# Patient Record
Sex: Female | Born: 1940 | Race: White | Hispanic: No | Smoking: Former smoker
Health system: Southern US, Community
[De-identification: ages and names within clinical notes are randomized; demographics above are authoritative.]

## PROBLEM LIST (undated history)

## (undated) DIAGNOSIS — E785 Hyperlipidemia, unspecified: Secondary | ICD-10-CM

## (undated) DIAGNOSIS — L130 Dermatitis herpetiformis: Secondary | ICD-10-CM

## (undated) DIAGNOSIS — K869 Disease of pancreas, unspecified: Secondary | ICD-10-CM

## (undated) DIAGNOSIS — N289 Disorder of kidney and ureter, unspecified: Secondary | ICD-10-CM

## (undated) DIAGNOSIS — K900 Celiac disease: Secondary | ICD-10-CM

## (undated) DIAGNOSIS — E0590 Thyrotoxicosis, unspecified without thyrotoxic crisis or storm: Secondary | ICD-10-CM

## (undated) DIAGNOSIS — K745 Biliary cirrhosis, unspecified: Secondary | ICD-10-CM

## (undated) DIAGNOSIS — F419 Anxiety disorder, unspecified: Secondary | ICD-10-CM

## (undated) DIAGNOSIS — K219 Gastro-esophageal reflux disease without esophagitis: Secondary | ICD-10-CM

## (undated) DIAGNOSIS — J449 Chronic obstructive pulmonary disease, unspecified: Secondary | ICD-10-CM

## (undated) DIAGNOSIS — K5792 Diverticulitis of intestine, part unspecified, without perforation or abscess without bleeding: Secondary | ICD-10-CM

## (undated) HISTORY — DX: Disease of pancreas, unspecified: K86.9

## (undated) HISTORY — PX: COLONOSCOPY: SHX174

## (undated) HISTORY — DX: Biliary cirrhosis, unspecified: K74.5

## (undated) HISTORY — PX: ELBOW SURGERY: SHX618

## (undated) HISTORY — DX: Diverticulitis of intestine, part unspecified, without perforation or abscess without bleeding: K57.92

## (undated) HISTORY — DX: Dermatitis herpetiformis: L13.0

## (undated) HISTORY — PX: MULTIPLE TOOTH EXTRACTIONS: SHX2053

## (undated) HISTORY — DX: Disorder of kidney and ureter, unspecified: N28.9

## (undated) HISTORY — DX: Celiac disease: K90.0

## (undated) HISTORY — PX: EYE SURGERY: SHX253

## (undated) HISTORY — DX: Anxiety disorder, unspecified: F41.9

## (undated) HISTORY — PX: UPPER GI ENDOSCOPY: SHX6162

## (undated) HISTORY — DX: Thyrotoxicosis, unspecified without thyrotoxic crisis or storm: E05.90

---

## 1960-11-25 HISTORY — PX: PANCREAS SURGERY: SHX731

## 1963-11-26 HISTORY — PX: THYROIDECTOMY: SHX17

## 2000-08-19 ENCOUNTER — Other Ambulatory Visit: Admission: RE | Admit: 2000-08-19 | Discharge: 2000-08-19 | Payer: Self-pay | Admitting: Obstetrics and Gynecology

## 2000-09-03 ENCOUNTER — Encounter: Payer: Self-pay | Admitting: Obstetrics and Gynecology

## 2000-09-03 ENCOUNTER — Encounter: Admission: RE | Admit: 2000-09-03 | Discharge: 2000-09-03 | Payer: Self-pay | Admitting: Obstetrics and Gynecology

## 2001-09-15 ENCOUNTER — Other Ambulatory Visit: Admission: RE | Admit: 2001-09-15 | Discharge: 2001-09-15 | Payer: Self-pay | Admitting: Obstetrics and Gynecology

## 2001-09-17 ENCOUNTER — Encounter: Payer: Self-pay | Admitting: Obstetrics and Gynecology

## 2001-09-17 ENCOUNTER — Encounter: Admission: RE | Admit: 2001-09-17 | Discharge: 2001-09-17 | Payer: Self-pay | Admitting: Obstetrics and Gynecology

## 2002-01-20 ENCOUNTER — Ambulatory Visit (HOSPITAL_COMMUNITY): Admission: RE | Admit: 2002-01-20 | Discharge: 2002-01-20 | Payer: Self-pay | Admitting: Gastroenterology

## 2002-01-20 ENCOUNTER — Encounter (INDEPENDENT_AMBULATORY_CARE_PROVIDER_SITE_OTHER): Payer: Self-pay | Admitting: *Deleted

## 2002-01-20 ENCOUNTER — Encounter: Payer: Self-pay | Admitting: Gastroenterology

## 2002-09-21 ENCOUNTER — Encounter: Payer: Self-pay | Admitting: Pulmonary Disease

## 2002-09-21 ENCOUNTER — Encounter (INDEPENDENT_AMBULATORY_CARE_PROVIDER_SITE_OTHER): Payer: Self-pay | Admitting: Specialist

## 2002-09-21 ENCOUNTER — Encounter (INDEPENDENT_AMBULATORY_CARE_PROVIDER_SITE_OTHER): Payer: Self-pay | Admitting: *Deleted

## 2002-09-21 ENCOUNTER — Ambulatory Visit (HOSPITAL_COMMUNITY): Admission: RE | Admit: 2002-09-21 | Discharge: 2002-09-21 | Payer: Self-pay | Admitting: Gastroenterology

## 2003-07-05 ENCOUNTER — Encounter: Admission: RE | Admit: 2003-07-05 | Discharge: 2003-07-05 | Payer: Self-pay | Admitting: *Deleted

## 2003-07-05 ENCOUNTER — Encounter: Payer: Self-pay | Admitting: *Deleted

## 2003-07-08 ENCOUNTER — Other Ambulatory Visit: Admission: RE | Admit: 2003-07-08 | Discharge: 2003-07-08 | Payer: Self-pay | Admitting: *Deleted

## 2004-07-27 ENCOUNTER — Ambulatory Visit (HOSPITAL_COMMUNITY): Admission: RE | Admit: 2004-07-27 | Discharge: 2004-07-27 | Payer: Self-pay | Admitting: Obstetrics and Gynecology

## 2004-10-16 ENCOUNTER — Other Ambulatory Visit: Admission: RE | Admit: 2004-10-16 | Discharge: 2004-10-16 | Payer: Self-pay | Admitting: *Deleted

## 2005-03-20 ENCOUNTER — Ambulatory Visit: Payer: Self-pay | Admitting: Pulmonary Disease

## 2005-04-03 ENCOUNTER — Ambulatory Visit: Payer: Self-pay | Admitting: Pulmonary Disease

## 2005-04-08 ENCOUNTER — Ambulatory Visit: Payer: Self-pay | Admitting: Pulmonary Disease

## 2005-10-03 ENCOUNTER — Ambulatory Visit: Payer: Self-pay | Admitting: Pulmonary Disease

## 2006-01-15 ENCOUNTER — Encounter: Admission: RE | Admit: 2006-01-15 | Discharge: 2006-01-15 | Payer: Self-pay | Admitting: *Deleted

## 2006-02-14 ENCOUNTER — Other Ambulatory Visit: Admission: RE | Admit: 2006-02-14 | Discharge: 2006-02-14 | Payer: Self-pay | Admitting: Obstetrics and Gynecology

## 2006-04-02 ENCOUNTER — Ambulatory Visit: Payer: Self-pay | Admitting: Pulmonary Disease

## 2006-04-08 ENCOUNTER — Ambulatory Visit: Payer: Self-pay | Admitting: Pulmonary Disease

## 2006-10-01 ENCOUNTER — Ambulatory Visit: Payer: Self-pay | Admitting: Pulmonary Disease

## 2006-10-09 ENCOUNTER — Ambulatory Visit: Payer: Self-pay | Admitting: Pulmonary Disease

## 2007-02-19 ENCOUNTER — Encounter: Admission: RE | Admit: 2007-02-19 | Discharge: 2007-02-19 | Payer: Self-pay | Admitting: Obstetrics and Gynecology

## 2007-09-23 LAB — HM COLONOSCOPY

## 2008-02-18 ENCOUNTER — Ambulatory Visit (HOSPITAL_COMMUNITY): Admission: RE | Admit: 2008-02-18 | Discharge: 2008-02-18 | Payer: Self-pay | Admitting: Obstetrics and Gynecology

## 2008-08-18 DIAGNOSIS — J069 Acute upper respiratory infection, unspecified: Secondary | ICD-10-CM | POA: Insufficient documentation

## 2008-08-18 DIAGNOSIS — M1990 Unspecified osteoarthritis, unspecified site: Secondary | ICD-10-CM | POA: Insufficient documentation

## 2008-08-18 DIAGNOSIS — E039 Hypothyroidism, unspecified: Secondary | ICD-10-CM

## 2008-08-18 DIAGNOSIS — K219 Gastro-esophageal reflux disease without esophagitis: Secondary | ICD-10-CM | POA: Insufficient documentation

## 2008-08-18 DIAGNOSIS — K5732 Diverticulitis of large intestine without perforation or abscess without bleeding: Secondary | ICD-10-CM

## 2008-08-18 DIAGNOSIS — J209 Acute bronchitis, unspecified: Secondary | ICD-10-CM | POA: Insufficient documentation

## 2008-08-18 DIAGNOSIS — E785 Hyperlipidemia, unspecified: Secondary | ICD-10-CM | POA: Insufficient documentation

## 2008-08-18 DIAGNOSIS — D126 Benign neoplasm of colon, unspecified: Secondary | ICD-10-CM

## 2008-08-18 DIAGNOSIS — M810 Age-related osteoporosis without current pathological fracture: Secondary | ICD-10-CM

## 2008-08-18 DIAGNOSIS — F411 Generalized anxiety disorder: Secondary | ICD-10-CM | POA: Insufficient documentation

## 2008-08-18 DIAGNOSIS — K745 Biliary cirrhosis, unspecified: Secondary | ICD-10-CM

## 2008-10-17 ENCOUNTER — Encounter: Payer: Self-pay | Admitting: Pulmonary Disease

## 2008-11-11 ENCOUNTER — Encounter: Payer: Self-pay | Admitting: Pulmonary Disease

## 2009-01-26 ENCOUNTER — Encounter: Payer: Self-pay | Admitting: Pulmonary Disease

## 2009-02-14 ENCOUNTER — Encounter: Payer: Self-pay | Admitting: Pulmonary Disease

## 2009-09-28 ENCOUNTER — Encounter: Payer: Self-pay | Admitting: Internal Medicine

## 2009-10-17 ENCOUNTER — Ambulatory Visit (HOSPITAL_COMMUNITY): Admission: RE | Admit: 2009-10-17 | Discharge: 2009-10-17 | Payer: Self-pay | Admitting: Nephrology

## 2009-10-17 ENCOUNTER — Ambulatory Visit: Payer: Self-pay

## 2009-10-17 ENCOUNTER — Ambulatory Visit: Payer: Self-pay | Admitting: Cardiology

## 2009-10-17 ENCOUNTER — Encounter (INDEPENDENT_AMBULATORY_CARE_PROVIDER_SITE_OTHER): Payer: Self-pay | Admitting: Nephrology

## 2009-10-27 ENCOUNTER — Ambulatory Visit: Payer: Self-pay | Admitting: Internal Medicine

## 2009-10-27 DIAGNOSIS — I959 Hypotension, unspecified: Secondary | ICD-10-CM | POA: Insufficient documentation

## 2009-11-08 ENCOUNTER — Ambulatory Visit (HOSPITAL_BASED_OUTPATIENT_CLINIC_OR_DEPARTMENT_OTHER): Admission: RE | Admit: 2009-11-08 | Discharge: 2009-11-08 | Payer: Self-pay | Admitting: Obstetrics and Gynecology

## 2009-11-08 ENCOUNTER — Ambulatory Visit: Payer: Self-pay | Admitting: Radiology

## 2010-05-15 ENCOUNTER — Encounter: Admission: RE | Admit: 2010-05-15 | Discharge: 2010-05-15 | Payer: Self-pay | Admitting: Nephrology

## 2010-10-09 ENCOUNTER — Encounter: Payer: Self-pay | Admitting: Pulmonary Disease

## 2010-10-23 ENCOUNTER — Encounter: Payer: Self-pay | Admitting: Pulmonary Disease

## 2010-11-22 ENCOUNTER — Encounter: Payer: Self-pay | Admitting: Pulmonary Disease

## 2010-12-10 ENCOUNTER — Ambulatory Visit (HOSPITAL_BASED_OUTPATIENT_CLINIC_OR_DEPARTMENT_OTHER)
Admission: RE | Admit: 2010-12-10 | Discharge: 2010-12-10 | Payer: Self-pay | Source: Home / Self Care | Attending: Obstetrics and Gynecology | Admitting: Obstetrics and Gynecology

## 2010-12-17 ENCOUNTER — Other Ambulatory Visit: Payer: Self-pay

## 2010-12-17 ENCOUNTER — Ambulatory Visit
Admission: RE | Admit: 2010-12-17 | Discharge: 2010-12-17 | Payer: Self-pay | Source: Home / Self Care | Attending: Internal Medicine | Admitting: Internal Medicine

## 2010-12-17 ENCOUNTER — Other Ambulatory Visit
Admission: RE | Admit: 2010-12-17 | Discharge: 2010-12-17 | Payer: Self-pay | Source: Home / Self Care | Admitting: Internal Medicine

## 2010-12-21 ENCOUNTER — Encounter: Payer: Self-pay | Admitting: Pulmonary Disease

## 2010-12-24 ENCOUNTER — Encounter: Payer: Self-pay | Admitting: Internal Medicine

## 2011-01-02 NOTE — Letter (Signed)
Summary: New Patient letter  [facility]  [place], [place] [address]   Phone: [phone]  Fax: [phone]       12/24/2010 MRN: [id number]  [facility] [address] [place], [place]  [address]  Dear Ms. Camey,  Welcome to the Gastroenterology Division at [facility].    You are scheduled to see Dr.  Henrene Pastor on 01-29-11 at 1:45pm on the 3rd floor at [facility] [place] [facility].  We ask that you try to arrive at our office 15 minutes prior to your appointment time to allow for check-in.  We would like you to complete the enclosed self-administered evaluation form prior to your visit and bring it with you on the day of your appointment.  We will review it with you.  Also, please bring a complete list of all your medications or, if you prefer, bring the medication bottles and we will list them.  Please bring your insurance card so that we may make a copy of it.  If your insurance requires a referral to see a specialist, please bring your referral form from your primary care physician.  Co-payments are due at the time of your visit and may be paid by cash, check or credit card.     Your office visit will consist of a consult with your physician (includes a physical exam), any laboratory testing he/she may order, scheduling of any necessary diagnostic testing (e.g. x-ray, ultrasound, CT-scan), and scheduling of a procedure (e.g. Endoscopy, Colonoscopy) if required.  Please allow enough time on your schedule to allow for any/all of these possibilities.    If you cannot keep your appointment, please call [phone] to cancel or reschedule prior to your appointment date.  This allows [place] the opportunity to schedule an appointment for another patient in need of care.  If you do not cancel or reschedule by 5 p.m. the business day prior to your appointment date, you will be charged a $50.00 late cancellation/no-show fee.    Thank you for choosing [facility] for your medical needs.  We appreciate the opportunity to care for you.  Please visit [place] at our website  to learn more about our practice.                     Sincerely,                                                             The Gastroenterology Division

## 2011-01-29 ENCOUNTER — Ambulatory Visit: Payer: Self-pay | Admitting: Internal Medicine

## 2011-01-31 NOTE — Procedures (Signed)
Summary: Colonoscopy-Dr. Medoff   Colonoscopy  Procedure date:  09/21/2002  Findings:      Location:  [facility]  Biliary CirrhosisResults:  Normal.   Colonoscopy  Procedure date:  09/21/2002  Findings:      Location:  [facility]  Biliary CirrhosisResults:  Normal.  Patient Name: Shari Schmitt, Shari Schmitt MRN: [id number] Procedure Procedures: Percutaneous Liver BiopsyCPT: 47000.   with Biopsy(s) / Brushing(s)  Blind Exam Personnel: Examiner: Mayme Genta, MD, Marval Regal.  Exam Location: Exam performed in Endoscopy Suite. Outpatient  Patient Consent: Procedure, Alternatives, Risks and Benefits discussed, consent obtained, from patient. Consent was obtained by the physician.  Indications  Evaluation of: Established Primary Biliary Cirrhosis.  History  Current Medications: Other: Proventil Other: Actonel PPI: Esomeprazole/Nexium Hormone therapy: Synthroid Bile Acids: Ursodiol Other: Alprazolam  Medical / Surgical History: Asthma, Osteoarthritis, Reflux Disease, Hypothyroidism, H/o pancreatic resection,  Allergies: Patient is allergic to Codeine.  Patient Habits Patient does not smoke. non-drinker drinks per day.  Pre-Exam Physical: Performed Sep 21, 2002. Entire physical exam was normal.  This exam is being done to assess current degree of liver injury.  Etiology: PBC/PSC.  Severity: Child's-Pugh Classification: A.  Exam Patient: ASA Classification: I. Tolerance: excellent.  Monitoring: Supplemental O2 given. BP and pulse monitoring done. Oximetry used. Vital signs were monitored every 15 mins. time four  Sedation Meds: Patient assessed and found to be appropriate for moderate (conscious) sedation. Sedation was managed by the Endoscopist. Versed 2 mg. Fentanyl 25 mcg.  Positioning: Patient in supine position with right arm raised.  Prep: The right side was percussed for site location. The site was prepped with Betadine Swabs, using 3  swabs. Sterile drapes were placed. Local anesthesia with 1% xylocaine. A total of 1 passes were made with a [place] needle, 16 gauge.  Exam Completion: A bandage was placed over procedure site.  Results  Liver Biopsy Results: Core material was obtained,  length 3 cms of non-fragmented material.  sent to lab. CPT: Liver BX.  Assessment Normal examination. The procedure was successful.  Diagnoses: ICD-9 Code: 571.6:  Primary Biliary Cirrhosis. ICD-9 Code: Marland Kitchen   Events  Unplanned Intervention: No intervention was required.  Unplanned Events: There were no complications. Plans  Post Exam Instructions: Nothing to eat or drink for 2.Clear or full liquids: 2. Resume previous diet: 4. No alcohol: indefinitely. No aspirin or non- steroidal containing medications: 2 weeks. Restart medications: tomorrow.  Medication(s): Continue current medications.  Disposition: After procedure patient sent to short stay unit. After recovery patient sent home.  Scheduling: Office Visit, 2 weeks - to review pathology report and discuss role of anti-viral treatment.,    This report was created from the original endoscopy report, which was reviewed and signed by the above listed endoscopist.   cc:  Teressa Lower, MD

## 2011-02-05 NOTE — Op Note (Signed)
Summary: Liver Biopsy / [facility] [place]  Liver Biopsy / [place]   Imported By: Rise Patience 01/30/2011 11:24:59  _____________________________________________________________________  External Attachment:    Type:   Image     Comment:   External Document

## 2011-02-05 NOTE — Letter (Signed)
Summary: Progress Note  Progress Note   Imported By: Rise Patience 01/30/2011 11:29:30  _____________________________________________________________________  External Attachment:    Type:   Image     Comment:   External Document

## 2011-02-05 NOTE — Letter (Signed)
Summary: Office Note  Office Note   Imported By: Rise Patience 01/30/2011 11:28:33  _____________________________________________________________________  External Attachment:    Type:   Image     Comment:   External Document

## 2011-03-06 ENCOUNTER — Other Ambulatory Visit (INDEPENDENT_AMBULATORY_CARE_PROVIDER_SITE_OTHER): Payer: Medicare Other

## 2011-03-06 ENCOUNTER — Ambulatory Visit (INDEPENDENT_AMBULATORY_CARE_PROVIDER_SITE_OTHER): Payer: Medicare Other | Admitting: Gastroenterology

## 2011-03-06 ENCOUNTER — Encounter: Payer: Self-pay | Admitting: Gastroenterology

## 2011-03-06 VITALS — BP 128/76 | HR 104 | Ht 61.0 in | Wt 111.0 lb

## 2011-03-06 DIAGNOSIS — K7460 Unspecified cirrhosis of liver: Secondary | ICD-10-CM

## 2011-03-06 DIAGNOSIS — R748 Abnormal levels of other serum enzymes: Secondary | ICD-10-CM

## 2011-03-06 DIAGNOSIS — N189 Chronic kidney disease, unspecified: Secondary | ICD-10-CM | POA: Insufficient documentation

## 2011-03-06 DIAGNOSIS — K900 Celiac disease: Secondary | ICD-10-CM | POA: Insufficient documentation

## 2011-03-06 DIAGNOSIS — L130 Dermatitis herpetiformis: Secondary | ICD-10-CM | POA: Insufficient documentation

## 2011-03-06 DIAGNOSIS — K745 Biliary cirrhosis, unspecified: Secondary | ICD-10-CM

## 2011-03-06 DIAGNOSIS — R740 Nonspecific elevation of levels of transaminase and lactic acid dehydrogenase [LDH]: Secondary | ICD-10-CM

## 2011-03-06 DIAGNOSIS — R7402 Elevation of levels of lactic acid dehydrogenase (LDH): Secondary | ICD-10-CM

## 2011-03-06 LAB — CBC WITH DIFFERENTIAL/PLATELET
Basophils Absolute: 0 10*3/uL (ref 0.0–0.1)
HCT: 39.7 % (ref 36.0–46.0)
Lymphs Abs: 1.5 10*3/uL (ref 0.7–4.0)
MCV: 87.2 fl (ref 78.0–100.0)
Monocytes Absolute: 0.5 10*3/uL (ref 0.1–1.0)
Monocytes Relative: 6.7 % (ref 3.0–12.0)
Platelets: 259 10*3/uL (ref 150.0–400.0)
RDW: 14.3 % (ref 11.5–14.6)

## 2011-03-06 LAB — COMPREHENSIVE METABOLIC PANEL
AST: 25 U/L (ref 0–37)
Albumin: 3.8 g/dL (ref 3.5–5.2)
BUN: 19 mg/dL (ref 6–23)
CO2: 26 mEq/L (ref 19–32)
Calcium: 9.2 mg/dL (ref 8.4–10.5)
Chloride: 103 mEq/L (ref 96–112)
Potassium: 4.1 mEq/L (ref 3.5–5.1)

## 2011-03-06 LAB — PROTIME-INR: INR: 1 ratio (ref 0.8–1.0)

## 2011-03-06 NOTE — Patient Instructions (Signed)
You will go to the basement for labs today Cc. Shari Schmitt

## 2011-03-06 NOTE — Progress Notes (Signed)
History of Present Illness:  Shari Schmitt is a pleasant, 70 year old white female referred at the request of Dr. Gordy Savers for ongoing treatment of primary biliary cirrhosis. This was diagnosed by liver biopsy in 1994. She subsequently underwent liver biopsy in 2003 and again at 2008. The last biopsy report is not available. 2003 biopsy suggested mild hepatitis with mild inflammation of the portal tracts.   Changes were felt  consistent with primary biliary cirrhosis. 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 She takes [employer]. She also has a history of celiac disease and dermatitis herpetiformis and maintains a gluten-free diet. She has a history of polyps and apparently was polyp-free after 2008 colonoscopy. Upper endoscopy apparently was normal.  She has no symptoms except for fatigue and occasional skin rash. She is feeling well. She has no specific GI complaints.  Review of Systems: Joint pains in her hands and legs.  Pertinent positive and negative review of systems were noted in the above HPI section. All other review of systems were otherwise negative.    Current Medications, Allergies, Past Medical History, Past Surgical History, Family History and Social History were reviewed in [facility] record  Vital signs were reviewed in today's medical record. Physical Exam: General: Well developed , well nourished, no acute distress Head: Normocephalic and atraumatic Eyes:  sclerae anicteric, EOMI Ears: Normal auditory acuity Mouth: No deformity or lesions Lungs: Clear throughout to auscultation Heart: Regular rate and rhythm; no murmurs, rubs or bruits Abdomen: Soft, non tender and non distended. No masses, hepatosplenomegaly or hernias noted. Normal Bowel sounds Rectal:deferred Musculoskeletal: Symmetrical with no gross deformities  Pulses:  Normal pulses noted Extremities: No clubbing, cyanosis, edema or deformities noted Neurological: Alert oriented x 4, grossly  nonfocal Psychological:  Alert and cooperative. Normal mood and affect

## 2011-03-06 NOTE — Assessment & Plan Note (Signed)
Plan to review prior records

## 2011-03-06 NOTE — Assessment & Plan Note (Signed)
Plan to check  celiac panel

## 2011-03-06 NOTE — Assessment & Plan Note (Addendum)
By history she has primary biliary cirrhosis.   There is no stigmata of liver disease to suggest advanced cirrhosis.  Recommendations #1 check CBC, INR, compress, and metabolic profile, and antimitochondrial antibody. #2 review prior biopsy and endoscopy reports.  #3 continue Urso

## 2011-03-07 LAB — GLIADIN ANTIBODIES, SERUM: Gliadin IgG: 4.4 U/mL (ref <20)

## 2011-03-14 ENCOUNTER — Telehealth: Payer: Self-pay

## 2011-03-14 NOTE — Telephone Encounter (Signed)
Message copied by Algernon Huxley on Thu Mar 14, 2011  2:17 PM ------      Message from: Erskine Emery MD      Created: Sun Mar 10, 2011 11:21 PM       sshe needs egd with bx to confirm celiac dz

## 2011-03-14 NOTE — Telephone Encounter (Signed)
Pt aware of the lab results per Dr. Deatra Ina and his recommendations. Pt states that she will call me back to schedule the procedure tomorrow when she is home.

## 2011-03-26 NOTE — Telephone Encounter (Signed)
Called and left message for pt to call back to scheduled egd with biopsy to confirm celiac disease.

## 2011-04-01 NOTE — Telephone Encounter (Signed)
Left message for pt to call back  °

## 2011-04-04 NOTE — Telephone Encounter (Signed)
Left message for pt to call back to schedule procedure

## 2011-04-05 NOTE — Telephone Encounter (Signed)
ok 

## 2011-04-05 NOTE — Telephone Encounter (Signed)
Left message for pt to call back.  Pt called back and states that at this point in her life she cannot schedule the EGD, she is going through a separation and through counseling. She states she will call back when she can schedule the procedure.

## 2011-04-29 ENCOUNTER — Telehealth: Payer: Self-pay

## 2011-04-29 NOTE — Telephone Encounter (Signed)
Message copied by Faythe Casa on Mon Apr 29, 2011 10:28 AM ------      Message from: Erskine Emery MD D      Created: Mon Apr 29, 2011  9:42 AM       Results are equivocal for celiac disease the patient should have an endoscopy with small bowel biopsies

## 2011-04-29 NOTE — Telephone Encounter (Signed)
Message copied by Faythe Casa on Mon Apr 29, 2011 10:23 AM ------      Message from: Erskine Emery MD D      Created: Mon Apr 29, 2011  9:42 AM       Results are equivocal for celiac disease the patient should have an endoscopy with small bowel biopsies

## 2011-04-29 NOTE — Telephone Encounter (Signed)
Pts labs are equivocal for celiac disease. Called and left a message for the pts son to obtain a working number for the pt. All numbers for her are non-working numbers. Pt needs to be scheduled for an endo with small bowel biopsies. Left message for son to call back.

## 2011-05-03 ENCOUNTER — Telehealth: Payer: Self-pay

## 2011-05-03 NOTE — Telephone Encounter (Signed)
Pt states she will call us back when she is ready to schedule the EGD.

## 2011-05-03 NOTE — Telephone Encounter (Signed)
Message copied by Faythe Casa on Fri May 03, 2011  9:17 AM ------      Message from: Erskine Emery MD D      Created: Mon Apr 29, 2011  9:42 AM       Results are equivocal for celiac disease the patient should have an endoscopy with small bowel biopsies

## 2011-05-03 NOTE — Telephone Encounter (Signed)
Pt aware of Dr. Kelby Fam recommendations. She states that at this point she has just recently separated from her husband and she cannot do the EGD now. Pt states she will call us back when she is ready to schedule the procedure.

## 2011-05-06 ENCOUNTER — Telehealth: Payer: Self-pay | Admitting: *Deleted

## 2011-05-06 NOTE — Telephone Encounter (Signed)
Opened in error

## 2011-05-14 ENCOUNTER — Ambulatory Visit (INDEPENDENT_AMBULATORY_CARE_PROVIDER_SITE_OTHER): Payer: Medicare Other | Admitting: Internal Medicine

## 2011-05-14 DIAGNOSIS — M810 Age-related osteoporosis without current pathological fracture: Secondary | ICD-10-CM

## 2011-05-14 DIAGNOSIS — E785 Hyperlipidemia, unspecified: Secondary | ICD-10-CM

## 2011-05-15 ENCOUNTER — Other Ambulatory Visit: Payer: Self-pay | Admitting: Nephrology

## 2011-05-15 ENCOUNTER — Ambulatory Visit
Admission: RE | Admit: 2011-05-15 | Discharge: 2011-05-15 | Disposition: A | Discharge status: Home / Self Care | Payer: Medicare Other | Source: Ambulatory Visit | Attending: Nephrology | Admitting: Nephrology

## 2011-05-15 DIAGNOSIS — Z87891 Personal history of nicotine dependence: Secondary | ICD-10-CM

## 2011-12-25 ENCOUNTER — Other Ambulatory Visit: Payer: Self-pay | Admitting: Internal Medicine

## 2011-12-25 DIAGNOSIS — Z1231 Encounter for screening mammogram for malignant neoplasm of breast: Secondary | ICD-10-CM

## 2012-01-01 ENCOUNTER — Ambulatory Visit (HOSPITAL_BASED_OUTPATIENT_CLINIC_OR_DEPARTMENT_OTHER)
Admission: RE | Admit: 2012-01-01 | Discharge: 2012-01-01 | Disposition: A | Discharge status: Home / Self Care | Payer: Medicare Other | Source: Ambulatory Visit | Attending: Internal Medicine | Admitting: Internal Medicine

## 2012-01-01 DIAGNOSIS — Z1231 Encounter for screening mammogram for malignant neoplasm of breast: Secondary | ICD-10-CM | POA: Diagnosis not present

## 2012-02-06 DIAGNOSIS — R7989 Other specified abnormal findings of blood chemistry: Secondary | ICD-10-CM | POA: Diagnosis not present

## 2012-04-22 ENCOUNTER — Other Ambulatory Visit: Payer: Self-pay | Admitting: Nephrology

## 2012-07-08 DIAGNOSIS — I10 Essential (primary) hypertension: Secondary | ICD-10-CM | POA: Diagnosis not present

## 2012-07-08 DIAGNOSIS — Z0000 Encounter for general adult medical examination without abnormal findings: Secondary | ICD-10-CM | POA: Diagnosis not present

## 2012-07-08 DIAGNOSIS — R7989 Other specified abnormal findings of blood chemistry: Secondary | ICD-10-CM | POA: Diagnosis not present

## 2012-07-16 ENCOUNTER — Other Ambulatory Visit: Payer: Self-pay | Admitting: Nephrology

## 2012-09-07 DIAGNOSIS — Z23 Encounter for immunization: Secondary | ICD-10-CM | POA: Diagnosis not present

## 2012-12-28 DIAGNOSIS — M810 Age-related osteoporosis without current pathological fracture: Secondary | ICD-10-CM | POA: Diagnosis not present

## 2013-01-01 ENCOUNTER — Other Ambulatory Visit (HOSPITAL_BASED_OUTPATIENT_CLINIC_OR_DEPARTMENT_OTHER): Payer: Self-pay | Admitting: Nephrology

## 2013-01-01 DIAGNOSIS — Z1231 Encounter for screening mammogram for malignant neoplasm of breast: Secondary | ICD-10-CM

## 2013-01-04 ENCOUNTER — Ambulatory Visit (HOSPITAL_BASED_OUTPATIENT_CLINIC_OR_DEPARTMENT_OTHER): Payer: Medicare Other

## 2013-01-11 ENCOUNTER — Ambulatory Visit (HOSPITAL_BASED_OUTPATIENT_CLINIC_OR_DEPARTMENT_OTHER)
Admission: RE | Admit: 2013-01-11 | Discharge: 2013-01-11 | Disposition: A | Discharge status: Home / Self Care | Payer: Medicare Other | Source: Ambulatory Visit | Attending: Nephrology | Admitting: Nephrology

## 2013-01-11 DIAGNOSIS — Z1231 Encounter for screening mammogram for malignant neoplasm of breast: Secondary | ICD-10-CM | POA: Insufficient documentation

## 2013-01-29 ENCOUNTER — Ambulatory Visit: Payer: Medicare Other | Admitting: Family

## 2013-02-01 ENCOUNTER — Ambulatory Visit (INDEPENDENT_AMBULATORY_CARE_PROVIDER_SITE_OTHER): Payer: Medicare Other | Admitting: Family

## 2013-02-01 ENCOUNTER — Encounter: Payer: Self-pay | Admitting: Family

## 2013-02-01 VITALS — BP 130/74 | HR 89 | Temp 97.8°F | Resp 16 | Ht 60.0 in | Wt 106.1 lb

## 2013-02-01 DIAGNOSIS — K743 Primary biliary cirrhosis: Secondary | ICD-10-CM

## 2013-02-01 DIAGNOSIS — K745 Biliary cirrhosis, unspecified: Secondary | ICD-10-CM | POA: Diagnosis not present

## 2013-02-01 DIAGNOSIS — K900 Celiac disease: Secondary | ICD-10-CM | POA: Diagnosis not present

## 2013-02-01 DIAGNOSIS — F411 Generalized anxiety disorder: Secondary | ICD-10-CM

## 2013-02-01 DIAGNOSIS — E785 Hyperlipidemia, unspecified: Secondary | ICD-10-CM

## 2013-02-01 DIAGNOSIS — M810 Age-related osteoporosis without current pathological fracture: Secondary | ICD-10-CM

## 2013-02-01 DIAGNOSIS — E039 Hypothyroidism, unspecified: Secondary | ICD-10-CM

## 2013-02-01 DIAGNOSIS — N189 Chronic kidney disease, unspecified: Secondary | ICD-10-CM

## 2013-02-01 DIAGNOSIS — L130 Dermatitis herpetiformis: Secondary | ICD-10-CM

## 2013-02-01 DIAGNOSIS — N289 Disorder of kidney and ureter, unspecified: Secondary | ICD-10-CM | POA: Diagnosis not present

## 2013-02-01 DIAGNOSIS — K219 Gastro-esophageal reflux disease without esophagitis: Secondary | ICD-10-CM

## 2013-02-01 MED ORDER — URSODIOL 300 MG PO CAPS: 300.0000 mg | ORAL_CAPSULE | Freq: Two times a day (BID) | ORAL | Status: DC

## 2013-02-01 MED ORDER — ALPRAZOLAM 0.5 MG PO TABS: 0.5000 mg | ORAL_TABLET | Freq: Every evening | ORAL | Status: DC | PRN

## 2013-02-01 NOTE — Assessment & Plan Note (Signed)
According to the pt this rash was deemed secondary to gluten allergy.

## 2013-02-01 NOTE — Assessment & Plan Note (Signed)
She maintains gluten free diet.

## 2013-02-01 NOTE — Assessment & Plan Note (Signed)
Check LFT, refer back to Dr. Deatra Ina for follow up.

## 2013-02-01 NOTE — Progress Notes (Signed)
Subjective:    Patient ID: Shari Schmitt, female    DOB: 1941/08/06, 72 y.o.   MRN: [id number]  HPI  Ms.  Kirkland is a 72 yr old female who presents today to establish care.  Sh has multiple medical problems. She has followed with Dr. Grant Fontana.    1) Celiac Disease- reports that she maintains a gluten free diet, gets rash when she eats gluten.   2) Hypothyroid- She is on synthroid.  Reports remote thyroidectomy due to overactive thyroid.  3) Chronic renal disease- Last creatinine on file within our system was 2.0.  She has seen Dr. Marval Regal and more recently saw Grant Fontana.  Has not been following regularly.  4) Anxiety- she uses xanax at bedtime night.  She reports that her anxiety is well controlled.    5) hyperlipidemia- She is maintained on lipitor.    6) GERD-Reports that reflux is well controlled.    7) Osteoporosis- She reports that she is not currently on any medication. She reports recent bone density performed at [facility] radiology.    8) Biliary Cirrhosis- Dr. Earlie Raveling.  Has been following her for this.  She is not currently following with anyone for this.    Review of Systems  Constitutional: Negative for unexpected weight change.  HENT: Negative for hearing loss.   Eyes: Negative for visual disturbance.  Respiratory: Negative for shortness of breath.   Cardiovascular: Negative for chest pain.  Gastrointestinal: Negative for nausea, vomiting and diarrhea.  Genitourinary: Negative for dysuria and frequency.  Musculoskeletal: Negative for arthralgias.  Skin:       Intermittent rash due to gluten  Neurological: Negative for headaches.  Psychiatric/Behavioral:       Denies depression       Past Medical History  Diagnosis Date  . Renal insufficiency   . Biliary cirrhosis   . Hypotension   . Anxiety   . Asthma   . Kidney disease   . Diverticulitis   . Pancreas disorder   . Hyperthyroidism   . Dermatitis herpetiformis   . Celiac disease      History   Social History  . Marital Status: Legally Separated    Spouse Name: N/A    Number of Children: 4  . Years of Education: N/A   Occupational History  . retired    Social History Main Topics  . Smoking status: Former Smoker    Types: Cigarettes    Quit date: 11/25/1981  . Smokeless tobacco: Never Used  . Alcohol Use: No  . Drug Use: No  . Sexually Active: Not on file   Other Topics Concern  . Not on file   Social History Narrative   Retired Engineer, manufacturing systems   5 sons- oldest son deceased due to injury   43 living sons all live locally   She lives alone   Completed the 10th grade   Legally separated   Enjoys gardening.             Past Surgical History  Procedure Laterality Date  . Pancreas surgery  1962    80% removed  . Thyroidectomy  1965    Family History  Problem Relation Age of Onset  . Emphysema Father     deceased  . Heart disease Mother     deceased  . Kidney disease Brother   . Colon cancer Neg Hx     Allergies  Allergen Reactions  . Codeine     Asthma exacerbation  . Gluten  Meal Rash    Current Outpatient Prescriptions on File Prior to Visit  Medication Sig Dispense Refill  . albuterol (PROAIR HFA) 108 (90 BASE) MCG/ACT inhaler Inhale 2 puffs into the lungs every 6 (six) hours as needed.        . ALPRAZolam (XANAX) 0.5 MG tablet Take 0.5 mg by mouth at bedtime as needed.        Marland Kitchen atorvastatin (LIPITOR) 10 MG tablet Take 10 mg by mouth daily.        Marland Kitchen esomeprazole (NEXIUM) 40 MG capsule Take 40 mg by mouth daily before breakfast.        . fexofenadine-pseudoephedrine (ALLEGRA-D) 60-120 MG per tablet Take 1 tablet by mouth daily.       Marland Kitchen levothyroxine (SYNTHROID, LEVOTHROID) 50 MCG tablet Take 50 mcg by mouth daily.        . ursodiol (ACTIGALL) 300 MG capsule Take 300 mg by mouth 2 (two) times daily.        . Multiple Vitamin (MULTIVITAMIN) tablet Take 1 tablet by mouth daily.         No current facility-administered medications  on file prior to visit.    BP 130/74  Pulse 89  Temp(Src) 97.8 F (36.6 C) (Oral)  Resp 16  Ht 5' (1.524 m)  Wt 106 lb 1.9 oz (48.136 kg)  BMI 20.73 kg/m2  SpO2 99%    Objective:   Physical Exam  Constitutional: She is oriented to person, place, and time. She appears well-developed and well-nourished. No distress.  HENT:  Head: Normocephalic and atraumatic.  Mouth/Throat: No oropharyngeal exudate.  Cardiovascular: Normal rate and regular rhythm.   No murmur heard. Pulmonary/Chest: Effort normal and breath sounds normal. No respiratory distress. She has no wheezes. She has no rales. She exhibits no tenderness.  Musculoskeletal: She exhibits no edema.  Lymphadenopathy:    She has no cervical adenopathy.  Neurological: She is alert and oriented to person, place, and time.  Skin: Skin is warm and dry.  Psychiatric: She has a normal mood and affect. Her behavior is normal. Judgment and thought content normal.          Assessment & Plan:

## 2013-02-01 NOTE — Assessment & Plan Note (Signed)
Tolerating statin, check LFT, plan flp next visit.

## 2013-02-01 NOTE — Assessment & Plan Note (Signed)
Check bmet.  Will refer to [facility] nephrology at pt's request.

## 2013-02-01 NOTE — Assessment & Plan Note (Signed)
Pt has name/number of the facility where she had bone density performed at home.  I have asked her to have them fax me her report.

## 2013-02-01 NOTE — Assessment & Plan Note (Signed)
Obtain TSH, continue synthroid, clinically stable.

## 2013-02-01 NOTE — Assessment & Plan Note (Signed)
Stable on HS xanax.

## 2013-02-01 NOTE — Patient Instructions (Addendum)
Please complete your lab work prior to leaving.  Please follow up in 3 months for a fasting medicare wellness visit.  Welcome to [facility].

## 2013-02-01 NOTE — Assessment & Plan Note (Signed)
Stable on Nexium, continue same. 

## 2013-02-02 ENCOUNTER — Ambulatory Visit: Payer: Medicare Other | Admitting: Family

## 2013-02-02 ENCOUNTER — Other Ambulatory Visit: Payer: Self-pay | Admitting: Family

## 2013-02-02 DIAGNOSIS — E039 Hypothyroidism, unspecified: Secondary | ICD-10-CM

## 2013-02-02 LAB — BASIC METABOLIC PANEL
CO2: 17 mEq/L — ABNORMAL LOW (ref 19–32)
Chloride: 109 mEq/L (ref 96–112)
Potassium: 4.2 mEq/L (ref 3.5–5.3)
Sodium: 138 mEq/L (ref 135–145)

## 2013-02-02 LAB — HEPATIC FUNCTION PANEL
Albumin: 4.4 g/dL (ref 3.5–5.2)
Alkaline Phosphatase: 93 U/L (ref 39–117)
Total Bilirubin: 0.5 mg/dL (ref 0.3–1.2)
Total Protein: 7.4 g/dL (ref 6.0–8.3)

## 2013-02-02 LAB — TSH: TSH: 19.37 u[IU]/mL — ABNORMAL HIGH (ref 0.350–4.500)

## 2013-02-02 MED ORDER — LEVOTHYROXINE SODIUM 100 MCG PO TABS: 100.0000 ug | ORAL_TABLET | Freq: Every day | ORAL | Status: DC

## 2013-02-02 NOTE — Telephone Encounter (Addendum)
Pls confirm that pt is taking synthroid 50mg  regularly.  If so, I would like her to increase to 49mcg based on lab results- repeat TSH in 6 weeks. Marland KitchenRx pended below. Liver function is normal.  Creatinine is 2.2.

## 2013-02-02 NOTE — Telephone Encounter (Signed)
Notified pt. She states she has been taking 24mcg once a day and has not missed any doses. Future lab order entered.  What dose would you like [place] to increase levothyroxine to?  Please advise.

## 2013-02-02 NOTE — Telephone Encounter (Signed)
Pt aware to check with pharmacy this evening or tomorrow for new dose.

## 2013-02-02 NOTE — Telephone Encounter (Signed)
Increase to 177mcg. Rx sent.

## 2013-02-22 ENCOUNTER — Ambulatory Visit (INDEPENDENT_AMBULATORY_CARE_PROVIDER_SITE_OTHER): Payer: Medicare Other | Admitting: Gastroenterology

## 2013-02-22 ENCOUNTER — Encounter: Payer: Self-pay | Admitting: Gastroenterology

## 2013-02-22 VITALS — BP 100/60 | HR 58 | Ht 60.0 in | Wt 107.0 lb

## 2013-02-22 DIAGNOSIS — D126 Benign neoplasm of colon, unspecified: Secondary | ICD-10-CM

## 2013-02-22 DIAGNOSIS — K745 Biliary cirrhosis, unspecified: Secondary | ICD-10-CM

## 2013-02-22 DIAGNOSIS — K900 Celiac disease: Secondary | ICD-10-CM

## 2013-02-22 NOTE — Patient Instructions (Addendum)
Follow up as needed

## 2013-02-22 NOTE — Assessment & Plan Note (Signed)
Last 2 colonoscopies apparently were negative, the most recent in 2008  Recommendations #1 repeat colonoscopy 2018

## 2013-02-22 NOTE — Assessment & Plan Note (Addendum)
Plan to continue urso definitely

## 2013-02-22 NOTE — Progress Notes (Signed)
History of Present Illness: Pleasant 72 year old white female with history of dermatitis herpetiformis, celiac disease, primary biliary cirrhosis, here for a routine followup exam. She has no GI complaints including abdominal pain, change of bowel habits or diarrhea. She follows a strict gluten-free diet.  If she eats a wheat-containing product she develops a pruritic rash.  She has had 2 colonoscopies, as recently as 2008, which were negative.  Liver function tests earlier this month were normal.    Past Medical History  Diagnosis Date  . Renal insufficiency   . Biliary cirrhosis   . Hypotension   . Anxiety   . Asthma   . Kidney disease   . Diverticulitis   . Pancreas disorder   . Hyperthyroidism   . Dermatitis herpetiformis   . Celiac disease    Past Surgical History  Procedure Laterality Date  . Pancreas surgery  1962    80% removed  . Thyroidectomy  1965   family history includes Emphysema in her father; Heart disease in her mother; and Kidney disease in her brother.  There is no history of Colon cancer. Current Outpatient Prescriptions  Medication Sig Dispense Refill  . albuterol (PROAIR HFA) 108 (90 BASE) MCG/ACT inhaler Inhale 2 puffs into the lungs every 6 (six) hours as needed.        . ALPRAZolam (XANAX) 0.5 MG tablet Take 1 tablet (0.5 mg total) by mouth at bedtime as needed.  30 tablet  0  . atorvastatin (LIPITOR) 10 MG tablet Take 10 mg by mouth daily.        Marland Kitchen esomeprazole (NEXIUM) 40 MG capsule Take 40 mg by mouth daily before breakfast.        . fexofenadine-pseudoephedrine (ALLEGRA-D) 60-120 MG per tablet Take 1 tablet by mouth daily.       . fluocinonide gel (LIDEX) [id number] % Apply 1 application topically as needed.      Marland Kitchen levothyroxine (SYNTHROID, LEVOTHROID) 100 MCG tablet Take 1 tablet (100 mcg total) by mouth daily.  30 tablet  1  . Multiple Vitamin (MULTIVITAMIN) tablet Take 1 tablet by mouth daily.        . ursodiol (ACTIGALL) 300 MG capsule Take 1 capsule (300  mg total) by mouth 2 (two) times daily.  180 capsule  1   No current facility-administered medications for this visit.   Allergies as of 02/22/2013 - Review Complete 02/22/2013  Allergen Reaction Noted  . Codeine    . Gluten meal Rash 02/01/2013    reports that she quit smoking about 31 years ago. Her smoking use included Cigarettes. She smoked 0.00 packs per day. She has never used smokeless tobacco. She reports that she does not drink alcohol or use illicit drugs.     Review of Systems: Pertinent positive and negative review of systems were noted in the above HPI section. All other review of systems were otherwise negative.  Vital signs were reviewed in today's medical record Physical Exam: General: Well developed , well nourished, no acute distress Skin: anicteric Head: Normocephalic and atraumatic Eyes:  sclerae anicteric, EOMI Ears: Normal auditory acuity Mouth: No deformity or lesions Neck: Supple, no masses or thyromegaly Lungs: Clear throughout to auscultation Heart: Regular rate and rhythm; no murmurs, rubs or bruits Abdomen: Soft, non tender and non distended. No masses, hepatosplenomegaly or hernias noted. Normal Bowel sounds Rectal:deferred Musculoskeletal: Symmetrical with no gross deformities  Skin: No lesions on visible extremities Pulses:  Normal pulses noted Extremities: No clubbing, cyanosis, edema or deformities noted Neurological:  Alert oriented x 4, grossly nonfocal Cervical Nodes:  No significant cervical adenopathy Inguinal Nodes: No significant inguinal adenopathy Psychological:  Alert and cooperative. Normal mood and affect

## 2013-02-22 NOTE — Assessment & Plan Note (Signed)
Condition is asymptomatic gluten-free diet. Plan to continue with the same

## 2013-03-02 ENCOUNTER — Emergency Department (HOSPITAL_BASED_OUTPATIENT_CLINIC_OR_DEPARTMENT_OTHER)
Admission: EM | Admit: 2013-03-02 | Discharge: 2013-03-03 | Disposition: A | Discharge status: Home / Self Care | Payer: Medicare Other | Attending: Emergency Medicine | Admitting: Emergency Medicine

## 2013-03-02 ENCOUNTER — Emergency Department (HOSPITAL_BASED_OUTPATIENT_CLINIC_OR_DEPARTMENT_OTHER): Payer: Medicare Other

## 2013-03-02 ENCOUNTER — Encounter (HOSPITAL_BASED_OUTPATIENT_CLINIC_OR_DEPARTMENT_OTHER): Payer: Self-pay | Admitting: *Deleted

## 2013-03-02 ENCOUNTER — Other Ambulatory Visit: Payer: Self-pay | Admitting: Family

## 2013-03-02 DIAGNOSIS — R443 Hallucinations, unspecified: Secondary | ICD-10-CM | POA: Insufficient documentation

## 2013-03-02 DIAGNOSIS — J45909 Unspecified asthma, uncomplicated: Secondary | ICD-10-CM | POA: Diagnosis not present

## 2013-03-02 DIAGNOSIS — R42 Dizziness and giddiness: Secondary | ICD-10-CM | POA: Diagnosis not present

## 2013-03-02 DIAGNOSIS — R5381 Other malaise: Secondary | ICD-10-CM | POA: Diagnosis not present

## 2013-03-02 DIAGNOSIS — R0602 Shortness of breath: Secondary | ICD-10-CM | POA: Diagnosis not present

## 2013-03-02 DIAGNOSIS — E0590 Thyrotoxicosis, unspecified without thyrotoxic crisis or storm: Secondary | ICD-10-CM | POA: Insufficient documentation

## 2013-03-02 DIAGNOSIS — Z8719 Personal history of other diseases of the digestive system: Secondary | ICD-10-CM | POA: Insufficient documentation

## 2013-03-02 DIAGNOSIS — F22 Delusional disorders: Secondary | ICD-10-CM

## 2013-03-02 DIAGNOSIS — F411 Generalized anxiety disorder: Secondary | ICD-10-CM | POA: Insufficient documentation

## 2013-03-02 DIAGNOSIS — Z8679 Personal history of other diseases of the circulatory system: Secondary | ICD-10-CM | POA: Diagnosis not present

## 2013-03-02 DIAGNOSIS — Z87448 Personal history of other diseases of urinary system: Secondary | ICD-10-CM | POA: Diagnosis not present

## 2013-03-02 DIAGNOSIS — Z79899 Other long term (current) drug therapy: Secondary | ICD-10-CM | POA: Insufficient documentation

## 2013-03-02 DIAGNOSIS — R110 Nausea: Secondary | ICD-10-CM | POA: Insufficient documentation

## 2013-03-02 DIAGNOSIS — Z872 Personal history of diseases of the skin and subcutaneous tissue: Secondary | ICD-10-CM | POA: Diagnosis not present

## 2013-03-02 DIAGNOSIS — Z87891 Personal history of nicotine dependence: Secondary | ICD-10-CM | POA: Insufficient documentation

## 2013-03-02 DIAGNOSIS — R599 Enlarged lymph nodes, unspecified: Secondary | ICD-10-CM | POA: Diagnosis not present

## 2013-03-02 DIAGNOSIS — R404 Transient alteration of awareness: Secondary | ICD-10-CM | POA: Diagnosis not present

## 2013-03-02 LAB — COMPREHENSIVE METABOLIC PANEL
Albumin: 3.8 g/dL (ref 3.5–5.2)
Alkaline Phosphatase: 89 U/L (ref 39–117)
BUN: 20 mg/dL (ref 6–23)
Creatinine, Ser: 2 mg/dL — ABNORMAL HIGH (ref 0.50–1.10)
GFR calc Af Amer: 27 mL/min — ABNORMAL LOW (ref >90)
Glucose, Bld: 93 mg/dL (ref 70–99)
Potassium: 4 mEq/L (ref 3.5–5.1)
Total Bilirubin: 0.3 mg/dL (ref 0.3–1.2)
Total Protein: 7.4 g/dL (ref 6.0–8.3)

## 2013-03-02 LAB — CBC WITH DIFFERENTIAL/PLATELET
Basophils Relative: 1 % (ref 0–1)
Eosinophils Absolute: 0.1 10*3/uL (ref 0.0–0.7)
HCT: 37.6 % (ref 36.0–46.0)
Hemoglobin: 12.6 g/dL (ref 12.0–15.0)
Lymphs Abs: 1.1 10*3/uL (ref 0.7–4.0)
MCH: 29.9 pg (ref 26.0–34.0)
MCHC: 33.5 g/dL (ref 30.0–36.0)
MCV: 89.3 fL (ref 78.0–100.0)
Monocytes Absolute: 0.5 10*3/uL (ref 0.1–1.0)
Monocytes Relative: 8 % (ref 3–12)
Neutrophils Relative %: 75 % (ref 43–77)
RBC: 4.21 MIL/uL (ref 3.87–5.11)

## 2013-03-02 LAB — RAPID URINE DRUG SCREEN, HOSP PERFORMED
Cocaine: NOT DETECTED
Opiates: NOT DETECTED

## 2013-03-02 MED ORDER — ZOLPIDEM TARTRATE 5 MG PO TABS
5.0000 mg | ORAL_TABLET | Freq: Every evening | ORAL | Status: DC | PRN
  Filled 2013-03-02: qty 1

## 2013-03-02 MED ORDER — LORAZEPAM 1 MG PO TABS
ORAL_TABLET | ORAL | Status: AC
  Administered 2013-03-02: 1 mg via ORAL
  Filled 2013-03-02: qty 1

## 2013-03-02 MED ORDER — LORAZEPAM 1 MG PO TABS: 1.0000 mg | ORAL_TABLET | Freq: Once | ORAL | Status: DC

## 2013-03-02 MED ORDER — ONDANSETRON HCL 8 MG PO TABS: 4.0000 mg | ORAL_TABLET | Freq: Three times a day (TID) | ORAL | Status: DC | PRN

## 2013-03-02 MED ORDER — ACETAMINOPHEN 325 MG PO TABS: 650.0000 mg | ORAL_TABLET | ORAL | Status: DC | PRN

## 2013-03-02 MED ORDER — LORAZEPAM 1 MG PO TABS
1.0000 mg | ORAL_TABLET | Freq: Once | ORAL | Status: AC
  Administered 2013-03-02: 1 mg via ORAL
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
## 2013-03-02 NOTE — ED Notes (Signed)
MD at bedside. 

## 2013-03-02 NOTE — Telephone Encounter (Signed)
Alprazolam request [last Rx 03.10.14 #30x0]/SLS Please advise.

## 2013-03-02 NOTE — ED Notes (Signed)
Therapeutic alternatives called for eval

## 2013-03-02 NOTE — ED Provider Notes (Addendum)
History     CSN: [id number]  Arrival date & time 03/02/13  1827   First MD Initiated Contact with Patient 03/02/13 1842      Chief Complaint  Patient presents with  . Weakness  . Dizziness  . Hallucinations    (Consider location/radiation/quality/duration/timing/severity/associated sxs/prior treatment) HPI  72 y.o. Female c.o. Nauseated at lunch before eating but ate spam sandwich.  No vomiting.  Denies pain abdomen or chest.  States she was sob.  Patient arrived by ambulance after calling ems.  PMD Inda Castle  Past Medical History  Diagnosis Date  . Renal insufficiency   . Biliary cirrhosis   . Hypotension   . Anxiety   . Asthma   . Kidney disease   . Diverticulitis   . Pancreas disorder   . Hyperthyroidism   . Dermatitis herpetiformis   . Celiac disease     Past Surgical History  Procedure Laterality Date  . Pancreas surgery  1962    80% removed  . Thyroidectomy  1965    Family History  Problem Relation Age of Onset  . Emphysema Father     deceased  . Heart disease Mother     deceased  . Kidney disease Brother   . Colon cancer Neg Hx     History  Substance Use Topics  . Smoking status: Former Smoker    Types: Cigarettes    Quit date: 11/25/1981  . Smokeless tobacco: Never Used  . Alcohol Use: No    OB History   Grav Para Term Preterm Abortions TAB SAB Ect Mult Living                  Review of Systems  Constitutional: Negative.   HENT: Negative.   Eyes: Negative.   Respiratory: Positive for shortness of breath.        States sob since nausea at noon  Cardiovascular: Negative.  Negative for chest pain and leg swelling.  Gastrointestinal: Positive for nausea. Negative for vomiting, abdominal pain, constipation, blood in stool, abdominal distention, anal bleeding and rectal pain.  Endocrine: Negative.   Genitourinary: Negative.   Allergic/Immunologic: Negative.   Neurological: Negative.   Hematological: Positive for adenopathy.   Psychiatric/Behavioral: Negative.   All other systems reviewed and are negative.    Allergies  Codeine and Gluten meal  Home Medications   Current Outpatient Rx  Name  Route  Sig  Dispense  Refill  . albuterol (PROAIR HFA) 108 (90 BASE) MCG/ACT inhaler   Inhalation   Inhale 2 puffs into the lungs every 6 (six) hours as needed.           . ALPRAZolam (XANAX) 0.5 MG tablet   Oral   Take 1 tablet (0.5 mg total) by mouth at bedtime as needed.   30 tablet   0   . atorvastatin (LIPITOR) 10 MG tablet   Oral   Take 10 mg by mouth daily.           Marland Kitchen esomeprazole (NEXIUM) 40 MG capsule   Oral   Take 40 mg by mouth daily before breakfast.           . fexofenadine-pseudoephedrine (ALLEGRA-D) 60-120 MG per tablet   Oral   Take 1 tablet by mouth daily.          . fluocinonide gel (LIDEX) 0.05 %   Topical   Apply 1 application topically as needed.         Marland Kitchen levothyroxine (SYNTHROID, LEVOTHROID) 100 MCG tablet  Oral   Take 1 tablet (100 mcg total) by mouth daily.   30 tablet   1   . Multiple Vitamin (MULTIVITAMIN) tablet   Oral   Take 1 tablet by mouth daily.           . ursodiol (ACTIGALL) 300 MG capsule   Oral   Take 300 mg by mouth 3 (three) times daily.           BP 156/72  Pulse 96  Temp(Src) 98.3 F (36.8 C) (Oral)  Resp 20  SpO2 100%  Physical Exam  Nursing note and vitals reviewed. Constitutional: She is oriented to person, place, and time. She appears well-developed and well-nourished.  HENT:  Head: Normocephalic and atraumatic.  Right Ear: External ear normal.  Left Ear: External ear normal.  Nose: Nose normal.  Mouth/Throat: Oropharynx is clear and moist.  Eyes: Conjunctivae and EOM are normal. Pupils are equal, round, and reactive to light.  Neck: Normal range of motion. Neck supple.  Cardiovascular: Normal rate, regular rhythm, normal heart sounds and intact distal pulses.   Pulmonary/Chest: Effort normal and breath sounds normal.   Abdominal: Soft. Bowel sounds are normal.  Musculoskeletal: Normal range of motion.  Neurological: She is alert and oriented to person, place, and time.  Skin: Skin is warm and dry.  Psychiatric: Her mood appears anxious. Her affect is blunt. Thought content is paranoid. Cognition and memory are normal.  Patient thinks the husband's gf is shocking her through the computer and is doing it ever here.     ED Course  Procedures (including critical care time)  Labs Reviewed - No data to display No results found.   No diagnosis found.   [facility] has interviewed patient. They have found further in termination from patient and patient's son who has arrived here. Patient has revealed more psychotic symptoms with feeling that she has bumps placed on the bottom of her feet. Besides the shock from the computer she has felt that there is a GPS placed on her under her skin and that she is trapped by this. Patient is extremely paranoid and has a hand gun in the house. She will be involuntarily committed for further evaluation by psychiatry.       Shaune Pollack, MD 03/02/13 2313  Discussed with Dr. Betsey Holiday and he will assume care.   Shaune Pollack, MD 03/02/13 [phone]

## 2013-03-02 NOTE — ED Notes (Signed)
Shari Schmitt [phone]

## 2013-03-02 NOTE — ED Notes (Signed)
Brought in by ems for generalized weakness and dizziness x 6 hrs

## 2013-03-02 NOTE — ED Notes (Signed)
Family at bedside. Shari Schmitt the son

## 2013-03-03 ENCOUNTER — Telehealth: Payer: Self-pay | Admitting: Family

## 2013-03-03 DIAGNOSIS — F411 Generalized anxiety disorder: Secondary | ICD-10-CM | POA: Diagnosis present

## 2013-03-03 DIAGNOSIS — F22 Delusional disorders: Secondary | ICD-10-CM | POA: Diagnosis not present

## 2013-03-03 DIAGNOSIS — J45909 Unspecified asthma, uncomplicated: Secondary | ICD-10-CM | POA: Diagnosis present

## 2013-03-03 DIAGNOSIS — N289 Disorder of kidney and ureter, unspecified: Secondary | ICD-10-CM | POA: Diagnosis present

## 2013-03-03 DIAGNOSIS — K745 Biliary cirrhosis, unspecified: Secondary | ICD-10-CM | POA: Diagnosis present

## 2013-03-03 LAB — URINALYSIS, ROUTINE W REFLEX MICROSCOPIC
Glucose, UA: 100 mg/dL — AB
Ketones, ur: NEGATIVE mg/dL
Leukocytes, UA: NEGATIVE
Nitrite: NEGATIVE
Specific Gravity, Urine: 1.004 — ABNORMAL LOW (ref 1.005–1.030)
pH: 7 (ref 5.0–8.0)

## 2013-03-03 NOTE — ED Provider Notes (Signed)
EKG:  Date: 03/03/2013  Rate: 85  Rhythm: normal sinus rhythm  QRS Axis: normal  Intervals: normal  ST/T Wave abnormalities: normal  Conduction Disutrbances: none  Narrative Interpretation: unremarkable      Orpah Greek, MD 03/03/13 [phone]

## 2013-03-03 NOTE — Telephone Encounter (Signed)
Left message for patient to contact us when she returns home from hospital.

## 2013-03-03 NOTE — Telephone Encounter (Signed)
Alprazolam request [Last Rx 03.10.14 #30x0]/SLS Please advise.

## 2013-03-03 NOTE — ED Notes (Signed)
[facility] Officer in ED to sign IVC papers.

## 2013-03-03 NOTE — ED Notes (Signed)
Andi from [facility] in ED for pt assessment.

## 2013-03-03 NOTE — ED Notes (Signed)
[facility] sheriff here to transport pt to [facility] psych unit, place in paper scrubs

## 2013-03-03 NOTE — Telephone Encounter (Signed)
Ok to send #30 with no refills.

## 2013-03-03 NOTE — ED Notes (Signed)
[facility] counselor called and states she will be here in 30 minutes.

## 2013-03-03 NOTE — Telephone Encounter (Signed)
Cancel refill for alprazolam, pt is admitted to psychiatry and will defer refills to psychiatry.

## 2013-03-04 ENCOUNTER — Other Ambulatory Visit: Payer: Self-pay | Admitting: Family

## 2013-03-04 MED ORDER — ALPRAZOLAM 0.5 MG PO TABS: 0.5000 mg | ORAL_TABLET | Freq: Every evening | ORAL | Status: DC | PRN

## 2013-03-04 NOTE — Addendum Note (Signed)
Addended by: Rockwell Germany on: 03/04/2013 09:37 AM   Modules accepted: Orders

## 2013-03-04 NOTE — Telephone Encounter (Signed)
Denied-duplicate request/SLS

## 2013-03-04 NOTE — Telephone Encounter (Signed)
Rx request to pharmacy

## 2013-03-15 ENCOUNTER — Ambulatory Visit (INDEPENDENT_AMBULATORY_CARE_PROVIDER_SITE_OTHER): Payer: Medicare Other | Admitting: Family

## 2013-03-15 ENCOUNTER — Encounter: Payer: Self-pay | Admitting: Family

## 2013-03-15 ENCOUNTER — Telehealth: Payer: Self-pay | Admitting: *Deleted

## 2013-03-15 VITALS — BP 124/82 | HR 89 | Temp 98.2°F | Resp 16 | Ht 60.0 in | Wt 104.0 lb

## 2013-03-15 DIAGNOSIS — E039 Hypothyroidism, unspecified: Secondary | ICD-10-CM

## 2013-03-15 DIAGNOSIS — E871 Hypo-osmolality and hyponatremia: Secondary | ICD-10-CM

## 2013-03-15 DIAGNOSIS — N189 Chronic kidney disease, unspecified: Secondary | ICD-10-CM | POA: Diagnosis not present

## 2013-03-15 DIAGNOSIS — F29 Unspecified psychosis not due to a substance or known physiological condition: Secondary | ICD-10-CM

## 2013-03-15 LAB — BASIC METABOLIC PANEL
CO2: 22 mEq/L (ref 19–32)
Chloride: 104 mEq/L (ref 96–112)
Creat: 1.97 mg/dL — ABNORMAL HIGH (ref 0.50–1.10)
Potassium: 4.2 mEq/L (ref 3.5–5.3)
Sodium: 136 mEq/L (ref 135–145)

## 2013-03-15 MED ORDER — LEVOTHYROXINE SODIUM 75 MCG PO TABS: 75.0000 ug | ORAL_TABLET | Freq: Every day | ORAL | Status: DC

## 2013-03-15 MED ORDER — QUETIAPINE FUMARATE ER 150 MG PO TB24
150.0000 mg | ORAL_TABLET | Freq: Every day | ORAL | Status: DC
Start: 2013-03-15 — End: 2013-05-11

## 2013-03-15 MED ORDER — URSODIOL 300 MG PO CAPS: 300.0000 mg | ORAL_CAPSULE | Freq: Three times a day (TID) | ORAL | Status: DC

## 2013-03-15 NOTE — Assessment & Plan Note (Signed)
Note was made of mild hyponatremia in ED. Repeat BMET today.

## 2013-03-15 NOTE — Progress Notes (Signed)
Subjective:    Patient ID: Shari Schmitt, female    DOB: 1941/03/02, 72 y.o.   MRN: [id number]  HPI  Shari Schmitt is a 72 yr old female who presents today for hospital follow up. She was brought to the ED on 03/02/13 with weakness, dizziness and hallucinations.  ED records are reviewed.  Due to finding of psychotic symptoms and pananoia, she was involuntarily committed for further evaluation by psychiatry. She was evaluated at [facility]. She is now on seroquel. She was started on seroquel.  Reports ongoing anxiety- "needs something for her nerves." She denies thoughts of hurting herself or others.  Denies visual or auditory hallucinations.  She saw Dr. Minette Brine at [facility].   She reports ongoing dizziness since 2012.  She reports occasional nausea without vomitting. She denies cough/cold symptoms.    Review of Systems See HPI  Past Medical History  Diagnosis Date  . Renal insufficiency   . Biliary cirrhosis   . Hypotension   . Anxiety   . Asthma   . Kidney disease   . Diverticulitis   . Pancreas disorder   . Hyperthyroidism   . Dermatitis herpetiformis   . Celiac disease     History   Social History  . Marital Status: Legally Separated    Spouse Name: N/A    Number of Children: 4  . Years of Education: N/A   Occupational History  . retired    Social History Main Topics  . Smoking status: Former Smoker    Types: Cigarettes    Quit date: 11/25/1981  . Smokeless tobacco: Never Used  . Alcohol Use: No  . Drug Use: No  . Sexually Active: Not on file   Other Topics Concern  . Not on file   Social History Narrative   Retired Engineer, manufacturing systems   5 sons- oldest son deceased due to injury   96 living sons all live locally   She lives alone   Completed the 10th grade   Legally separated   Enjoys gardening.             Past Surgical History  Procedure Laterality Date  . Pancreas surgery  1962    80% removed  . Thyroidectomy  1965    Family History  Problem Relation  Age of Onset  . Emphysema Father     deceased  . Heart disease Mother     deceased  . Kidney disease Brother   . Colon cancer Neg Hx     Allergies  Allergen Reactions  . Codeine     Asthma exacerbation  . Gluten Meal Rash    Current Outpatient Prescriptions on File Prior to Visit  Medication Sig Dispense Refill  . albuterol (PROAIR HFA) 108 (90 BASE) MCG/ACT inhaler Inhale 2 puffs into the lungs every 6 (six) hours as needed.        . ALPRAZolam (XANAX) 0.5 MG tablet Take 1 tablet (0.5 mg total) by mouth at bedtime as needed.  30 tablet  0  . atorvastatin (LIPITOR) 10 MG tablet Take 10 mg by mouth daily.        Marland Kitchen esomeprazole (NEXIUM) 40 MG capsule Take 40 mg by mouth daily before breakfast.        . fluocinonide gel (LIDEX) AB-123456789 % Apply 1 application topically as needed.      Marland Kitchen levothyroxine (SYNTHROID, LEVOTHROID) 100 MCG tablet Take 1 tablet (100 mcg total) by mouth daily.  30 tablet  1  . Multiple Vitamin (MULTIVITAMIN)  tablet Take 1 tablet by mouth daily.        . ursodiol (ACTIGALL) 300 MG capsule Take 300 mg by mouth 3 (three) times daily.      . fexofenadine-pseudoephedrine (ALLEGRA-D) 60-120 MG per tablet Take 1 tablet by mouth daily.        No current facility-administered medications on file prior to visit.    BP 124/82  Pulse 89  Temp(Src) 98.2 F (36.8 C) (Oral)  Resp 16  Ht 5' (1.524 m)  Wt 104 lb (47.174 kg)  BMI 20.31 kg/m2  SpO2 99%       Objective:   Physical Exam  Constitutional: She is oriented to person, place, and time.  Thin white female, awake, alert, mildly anxious appearing but in NA  HENT:  Head: Normocephalic and atraumatic.  Cardiovascular: Normal rate and regular rhythm.   No murmur heard. Pulmonary/Chest: Effort normal and breath sounds normal. No respiratory distress. She has no wheezes. She has no rales. She exhibits no tenderness.  Neurological: She is alert and oriented to person, place, and time.  Skin: Skin is warm and dry.   Psychiatric: She has a normal mood and affect. Her behavior is normal. Judgment and thought content normal.          Assessment & Plan:

## 2013-03-15 NOTE — Telephone Encounter (Signed)
Pt left message on voicemail requesting refill of [facility]an[facility] to [facility]. States she went to pick up Rx today and the pharmacy did not have it.  Last refill was 03/04/13 #30 x no refills. Please advise.

## 2013-03-15 NOTE — Patient Instructions (Addendum)
You will be contacted about your referral to psychiatry. Please let us know if you have not heard back within 1 week about your referral. Complete your lab work prior to leaving.  Follow up in 3 months.

## 2013-03-15 NOTE — Assessment & Plan Note (Signed)
Pt appears improved today compared to ED notes day of admit.  Will refer to Dr. Casimiro Needle for ongoing care.   She continues to have some anxiety.  Continue seroquel and xanax.

## 2013-03-15 NOTE — Assessment & Plan Note (Signed)
Only able to tolerated synthroid 36mcg.  Reports she had not been taking when she last saw [place] (TSH was 19 at that time).  Report lab was repeated during her hospitalization and was normal. Will request lab work from her hospitalization.

## 2013-03-16 ENCOUNTER — Encounter: Payer: Self-pay | Admitting: Family

## 2013-03-16 MED ORDER — ALPRAZOLAM 0.5 MG PO TABS: 0.5000 mg | ORAL_TABLET | Freq: Two times a day (BID) | ORAL | Status: DC | PRN

## 2013-03-16 NOTE — Telephone Encounter (Signed)
[facility], #60 x no refills.

## 2013-03-16 NOTE — Telephone Encounter (Signed)
We can change rx to bid prn and send #60 no refills

## 2013-03-17 ENCOUNTER — Telehealth: Payer: Self-pay | Admitting: Family

## 2013-03-17 NOTE — Telephone Encounter (Signed)
Received medical records from [facility]

## 2013-04-03 ENCOUNTER — Other Ambulatory Visit: Payer: Self-pay | Admitting: Family

## 2013-04-05 NOTE — Telephone Encounter (Signed)
Resent rx to [facility] pharmacy.

## 2013-05-03 ENCOUNTER — Ambulatory Visit: Payer: Medicare Other | Admitting: Family

## 2013-05-07 ENCOUNTER — Ambulatory Visit: Payer: Medicare Other | Admitting: Family

## 2013-05-11 ENCOUNTER — Ambulatory Visit (INDEPENDENT_AMBULATORY_CARE_PROVIDER_SITE_OTHER): Payer: Medicare Other | Admitting: Family

## 2013-05-11 ENCOUNTER — Encounter: Payer: Self-pay | Admitting: Family

## 2013-05-11 VITALS — BP 138/72 | HR 78 | Temp 97.8°F | Resp 14 | Wt 101.5 lb

## 2013-05-11 DIAGNOSIS — E871 Hypo-osmolality and hyponatremia: Secondary | ICD-10-CM | POA: Diagnosis not present

## 2013-05-11 DIAGNOSIS — F29 Unspecified psychosis not due to a substance or known physiological condition: Secondary | ICD-10-CM | POA: Diagnosis not present

## 2013-05-11 DIAGNOSIS — E039 Hypothyroidism, unspecified: Secondary | ICD-10-CM | POA: Diagnosis not present

## 2013-05-11 MED ORDER — FLUOCINONIDE 0.05 % EX GEL
1.0000 | CUTANEOUS | Status: DC | PRN
Start: 2013-05-11 — End: 2013-07-01

## 2013-05-11 NOTE — Progress Notes (Signed)
Subjective:    Patient ID: Shari Schmitt, female    DOB: 12/24/1940, 72 y.o.   MRN: [id number]  HPI  Shari Schmitt is a 72 yr old female who presents today for follow up.  Hypothyroid- she is currently maintained on synthroid 25mcg. Reports that she was unable to tolerate the 112mcg dose due to feeling jittery.   Psychosis/depression- last visit a referral was made to Dr. Casimiro Needle. Did not see him due to cost. Declines further psych referral.  Reports that depression is currently well controlled.   Hyponatremia- follow up bmet noted normal sodium last visit.   Review of Systems See HPI  Past Medical History  Diagnosis Date  . Renal insufficiency   . Biliary cirrhosis   . Hypotension   . Anxiety   . Asthma   . Kidney disease   . Diverticulitis   . Pancreas disorder   . Hyperthyroidism   . Dermatitis herpetiformis   . Celiac disease     History   Social History  . Marital Status: Legally Separated    Spouse Name: N/A    Number of Children: 4  . Years of Education: N/A   Occupational History  . retired    Social History Main Topics  . Smoking status: Former Smoker    Types: Cigarettes    Quit date: 11/25/1981  . Smokeless tobacco: Never Used  . Alcohol Use: No  . Drug Use: No  . Sexually Active: Not on file   Other Topics Concern  . Not on file   Social History Narrative   Retired Engineer, manufacturing systems   5 sons- oldest son deceased due to injury   80 living sons all live locally   She lives alone   Completed the 10th grade   Legally separated   Enjoys gardening.             Past Surgical History  Procedure Laterality Date  . Pancreas surgery  1962    80% removed  . Thyroidectomy  1965    Family History  Problem Relation Age of Onset  . Emphysema Father     deceased  . Heart disease Mother     deceased  . Kidney disease Brother   . Colon cancer Neg Hx     Allergies  Allergen Reactions  . Codeine     Asthma exacerbation  . Gluten Meal Rash     Current Outpatient Prescriptions on File Prior to Visit  Medication Sig Dispense Refill  . albuterol (PROAIR HFA) 108 (90 BASE) MCG/ACT inhaler Inhale 2 puffs into the lungs every 6 (six) hours as needed.        . ALPRAZolam (XANAX) 0.5 MG tablet Take 1 tablet (0.5 mg total) by mouth 2 (two) times daily as needed for anxiety.  60 tablet  0  . atorvastatin (LIPITOR) 10 MG tablet Take 10 mg by mouth daily.        Marland Kitchen esomeprazole (NEXIUM) 40 MG capsule Take 40 mg by mouth daily before breakfast.        . fexofenadine-pseudoephedrine (ALLEGRA-D) 60-120 MG per tablet Take 1 tablet by mouth daily.       Marland Kitchen levothyroxine (SYNTHROID, LEVOTHROID) 75 MCG tablet Take 1 tablet (75 mcg total) by mouth daily.  30 tablet  3  . Multiple Vitamin (MULTIVITAMIN) tablet Take 1 tablet by mouth daily.        . ursodiol (ACTIGALL) 300 MG capsule Take 1 capsule (300 mg total) by mouth 3 (three) times  daily.  270 capsule  1  . levothyroxine (SYNTHROID, LEVOTHROID) 100 MCG tablet Take 1 tablet (100 mcg total) by mouth daily before breakfast.  30 tablet  3   No current facility-administered medications on file prior to visit.    BP 138/72  Pulse 78  Temp(Src) 97.8 F (36.6 C) (Oral)  Resp 14  Wt 101 lb 8 oz (46.04 kg)  BMI 19.82 kg/m2  SpO2 99%       Objective:   Physical Exam  Constitutional: She is oriented to person, place, and time. She appears well-developed and well-nourished. No distress.  Cardiovascular: Normal rate and regular rhythm.   No murmur heard. Pulmonary/Chest: Effort normal and breath sounds normal. No respiratory distress. She has no wheezes. She has no rales. She exhibits no tenderness.  Musculoskeletal: She exhibits no edema.  Neurological: She is alert and oriented to person, place, and time.  Psychiatric: She has a normal mood and affect. Her behavior is normal. Judgment and thought content normal.          Assessment & Plan:

## 2013-05-11 NOTE — Patient Instructions (Addendum)
Please complete your lab work prior to leaving. Follow up in 3 months.   

## 2013-05-13 DIAGNOSIS — E871 Hypo-osmolality and hyponatremia: Secondary | ICD-10-CM | POA: Insufficient documentation

## 2013-05-13 NOTE — Assessment & Plan Note (Signed)
Clinically stable. Obtain TSH, continue synthroid.

## 2013-05-13 NOTE — Assessment & Plan Note (Signed)
Resolved.  Unfortunately, she did not follow through with psychiatric referral and she declines referral to any other psychiatric provider.

## 2013-05-13 NOTE — Assessment & Plan Note (Signed)
Resolved

## 2013-05-14 ENCOUNTER — Other Ambulatory Visit: Payer: Self-pay | Admitting: Family

## 2013-05-14 NOTE — Telephone Encounter (Signed)
rx faxed to pharmacy/SLS

## 2013-05-14 NOTE — Telephone Encounter (Signed)
Request for Alprazolam Last Rx: 04.22.14, #60x0 Last OV: 06.17.14 Return: 3-Months Please advise on refills/SLS

## 2013-05-15 ENCOUNTER — Encounter: Payer: Self-pay | Admitting: Family

## 2013-05-15 NOTE — Telephone Encounter (Signed)
Ok t send #60 no refills.

## 2013-06-14 ENCOUNTER — Ambulatory Visit (INDEPENDENT_AMBULATORY_CARE_PROVIDER_SITE_OTHER): Payer: Medicare Other | Admitting: Family

## 2013-06-14 ENCOUNTER — Encounter: Payer: Self-pay | Admitting: Family

## 2013-06-14 ENCOUNTER — Telehealth: Payer: Self-pay | Admitting: Family

## 2013-06-14 ENCOUNTER — Other Ambulatory Visit: Payer: Self-pay | Admitting: Family Medicine

## 2013-06-14 VITALS — BP 130/70 | HR 94 | Temp 97.5°F | Resp 14 | Wt 102.0 lb

## 2013-06-14 DIAGNOSIS — E039 Hypothyroidism, unspecified: Secondary | ICD-10-CM | POA: Diagnosis not present

## 2013-06-14 DIAGNOSIS — E785 Hyperlipidemia, unspecified: Secondary | ICD-10-CM

## 2013-06-14 DIAGNOSIS — F411 Generalized anxiety disorder: Secondary | ICD-10-CM

## 2013-06-14 DIAGNOSIS — N189 Chronic kidney disease, unspecified: Secondary | ICD-10-CM | POA: Diagnosis not present

## 2013-06-14 DIAGNOSIS — K219 Gastro-esophageal reflux disease without esophagitis: Secondary | ICD-10-CM

## 2013-06-14 LAB — TSH: TSH: 0.15 u[IU]/mL — ABNORMAL LOW (ref 0.350–4.500)

## 2013-06-14 MED ORDER — LEVOTHYROXINE SODIUM 75 MCG PO TABS: 75.0000 ug | ORAL_TABLET | Freq: Every day | ORAL | Status: DC

## 2013-06-14 NOTE — Assessment & Plan Note (Signed)
We do not have recent lipid panel on file. Plan FLP next visit. LFT stable.

## 2013-06-14 NOTE — Patient Instructions (Addendum)
Please complete your lab work prior to leaving. Follow up in 3 months.   

## 2013-06-14 NOTE — Telephone Encounter (Signed)
eScribe request for refill on Alprazolam Last filled - 06.20.14, #60x0 Last AEX - 06.17.14 Please advise/SLS

## 2013-06-14 NOTE — Assessment & Plan Note (Signed)
Will check TSH.  Continue synthroid.

## 2013-06-14 NOTE — Telephone Encounter (Signed)
Message copied by Debbrah Alar on Mon Jun 14, 2013 12:48 PM ------      Message from: Jiles Garter E      Created: Mon Jun 14, 2013 12:00 PM              She had appointment @ [facility]  On April 30,  She cancelled her appt said she was going out of town  And she wiould call back to schedule      ----- Message -----         From: Debbrah Alar, NP         Sent: 06/14/2013  11:20 AM           To: Allegra Lai            What is status of her referral to nephrology? thanks       ------

## 2013-06-14 NOTE — Assessment & Plan Note (Signed)
Stable on nexium.  Continue same.

## 2013-06-14 NOTE — Telephone Encounter (Signed)
Printed and given to pt

## 2013-06-14 NOTE — Assessment & Plan Note (Signed)
She has not seen renal. Will check status of referral.

## 2013-06-14 NOTE — Assessment & Plan Note (Signed)
Stable on prn xanax.

## 2013-06-14 NOTE — Progress Notes (Signed)
Subjective:    Patient ID: Shari Schmitt, female    DOB: 08-08-1941, 72 y.o.   MRN: [id number]  HPI  Shari Schmitt is a 72 yr old female who presents today for follow up.  1) Hyperlipidemia- on atorvastatin.   2) Hypothyroid- currently maintained on synthroid 75 mcg. Feels well on this dose.    3) GERD-  Currently maintained on nexium. Reports that this is well controlled.    4) anxiety- going through a divorce.  Feels that this is well controlled.  Denies depression.   Review of Systems See HPI  Past Medical History  Diagnosis Date  . Renal insufficiency   . Biliary cirrhosis   . Hypotension   . Anxiety   . Asthma   . Kidney disease   . Diverticulitis   . Pancreas disorder   . Hyperthyroidism   . Dermatitis herpetiformis   . Celiac disease     History   Social History  . Marital Status: Legally Separated    Spouse Name: N/A    Number of Children: 4  . Years of Education: N/A   Occupational History  . retired    Social History Main Topics  . Smoking status: Former Smoker    Types: Cigarettes    Quit date: 11/25/1981  . Smokeless tobacco: Never Used  . Alcohol Use: No  . Drug Use: No  . Sexually Active: Not on file   Other Topics Concern  . 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             Past Surgical History  Procedure Laterality Date  . Pancreas surgery  1962    80% removed  . Thyroidectomy  1965    Family History  Problem Relation Age of Onset  . Emphysema Father     deceased  . Heart disease Mother     deceased  . Kidney disease Brother   . Colon cancer Neg Hx     Allergies  Allergen Reactions  . Codeine     Asthma exacerbation  . Gluten Meal Rash    Current Outpatient Prescriptions on File Prior to Visit  Medication Sig Dispense  Refill  . albuterol (PROAIR HFA) 108 (90 BASE) MCG/ACT inhaler Inhale 2 puffs into the lungs every 6 (six) hours as needed.        Marland Kitchen atorvastatin (LIPITOR) 10 MG tablet Take 10 mg by mouth daily.        Marland Kitchen esomeprazole (NEXIUM) 40 MG capsule Take 40 mg by mouth daily before breakfast.        . fexofenadine-pseudoephedrine (ALLEGRA-D) 60-120 MG per tablet Take 1 tablet by mouth daily.       . fluocinonide gel (LIDEX) AB-123456789 % Apply 1 application topically as needed.  60 g  0  . Multiple Vitamin (MULTIVITAMIN) tablet Take 1 tablet by mouth daily.        . ursodiol (ACTIGALL) 300 MG capsule Take 1 capsule (300 mg total) by mouth 3 (three) times daily.  270 capsule  1   No current facility-administered medications on file prior to visit.    BP 130/70  Pulse 94  Temp(Src) 97.5 F (36.4 C) (Oral)  Resp 14  Wt 102 lb (46.267 kg)  BMI 19.92 kg/m2  SpO2 94%       Objective:   Physical Exam  Constitutional: She is oriented to person, place, and time. She appears well-developed and well-nourished. No distress.  HENT:  Head: Normocephalic and atraumatic.  Cardiovascular: Normal rate and regular rhythm.   No murmur heard. Pulmonary/Chest: Effort normal and breath sounds normal. No respiratory distress. She has no wheezes. She has no rales. She exhibits no tenderness.  Lymphadenopathy:    She has no cervical adenopathy.  Neurological: She is alert and oriented to person, place, and time.  Psychiatric: She has a normal mood and affect. Her behavior is normal. Judgment and thought content normal.          Assessment & Plan:

## 2013-06-14 NOTE — Telephone Encounter (Signed)
Please call  Pt and let her know that her info has been sent to [facility] nephrology.  She cancelled original apt on 4/30.  She should contact their office to reschedule. [phone]

## 2013-06-15 ENCOUNTER — Telehealth: Payer: Self-pay | Admitting: Family

## 2013-06-15 DIAGNOSIS — N189 Chronic kidney disease, unspecified: Secondary | ICD-10-CM

## 2013-06-15 DIAGNOSIS — E039 Hypothyroidism, unspecified: Secondary | ICD-10-CM

## 2013-06-15 DIAGNOSIS — N289 Disorder of kidney and ureter, unspecified: Secondary | ICD-10-CM

## 2013-06-15 NOTE — Telephone Encounter (Signed)
Please call pt and let her know that her TSH shows that her thyroid dose is a little high. I would like her to continue synthroid 75 mcg tabs. Take one table by mouth daily alternating with 1/2 tablet by mouth daily.  Repeat TSH in 6 weeks.

## 2013-06-15 NOTE — Telephone Encounter (Signed)
Left message for pt to return my call.

## 2013-06-16 ENCOUNTER — Telehealth: Payer: Self-pay | Admitting: Family

## 2013-06-16 NOTE — Telephone Encounter (Signed)
Opened in error

## 2013-06-16 NOTE — Telephone Encounter (Signed)
Patient returned phone call. Best # [phone]

## 2013-06-17 NOTE — Telephone Encounter (Signed)
Notified pt and she voices understanding. Order entered.

## 2013-06-21 NOTE — Telephone Encounter (Signed)
Left message to return my call.  

## 2013-06-22 NOTE — Telephone Encounter (Signed)
Pt returned my call and was notified of instructions below. She states her previous doctor had recommended her to a kidney specialist on [address] in [place]. She doesn't remember their name and has not seen them but states she would rather see a nephrologist in [place]. Pt will call and let us know once she arranges appt and will let us know who it is with.

## 2013-06-22 NOTE — Telephone Encounter (Signed)
Left message to return my call on home # and mailed letter to pt.

## 2013-06-24 NOTE — Telephone Encounter (Signed)
Pt called back stating [facility] is requesting that we send another referral to them. Referral re-entered. Pt will call them to schedule appt.

## 2013-06-24 NOTE — Addendum Note (Signed)
Addended by: Kelle Darting A on: 06/24/2013 11:16 AM   Modules accepted: Orders

## 2013-07-01 ENCOUNTER — Other Ambulatory Visit: Payer: Self-pay | Admitting: Family Medicine

## 2013-07-01 ENCOUNTER — Other Ambulatory Visit: Payer: Self-pay | Admitting: Family

## 2013-07-02 NOTE — Telephone Encounter (Signed)
Xanax cannot be refilled prior to 8/20.

## 2013-07-05 ENCOUNTER — Other Ambulatory Visit: Payer: Self-pay | Admitting: Family

## 2013-07-05 NOTE — Telephone Encounter (Signed)
Notified pt and she voices understanding. Pt states she was trying to get a refill before she went out of town. Advised pt to call us with pharmacy info if she needs a refill while out of town and she voices understanding.

## 2013-07-19 ENCOUNTER — Other Ambulatory Visit: Payer: Self-pay | Admitting: Family

## 2013-07-20 NOTE — Telephone Encounter (Signed)
Refill called to pharmacy voicemail. 

## 2013-07-20 NOTE — Telephone Encounter (Signed)
Ok to send #60 no refills.

## 2013-07-20 NOTE — Telephone Encounter (Signed)
Please advise.  Requested Medications    Medication name:  Name from pharmacy:  ALPRAZolam (XANAX) 0.5 MG tablet  ALPRAZOLAM 0.5 MG TABLET   Sig: TAKE 1 TABLET BY MOUTH TWICE A DAY AS NEEDED   Dispense: 60 tablet Refills: 0 Start: 07/19/2013  Class: Normal   Requested on: 06/14/2013   Originally ordered on: 03/05/2011 Last refill: 06/14/2013

## 2013-07-23 ENCOUNTER — Other Ambulatory Visit: Payer: Self-pay | Admitting: Family

## 2013-07-23 DIAGNOSIS — E039 Hypothyroidism, unspecified: Secondary | ICD-10-CM | POA: Diagnosis not present

## 2013-07-24 LAB — TSH: TSH: 0.528 u[IU]/mL (ref 0.350–4.500)

## 2013-07-25 ENCOUNTER — Encounter: Payer: Self-pay | Admitting: Family

## 2013-08-10 ENCOUNTER — Ambulatory Visit (INDEPENDENT_AMBULATORY_CARE_PROVIDER_SITE_OTHER): Payer: Medicare Other | Admitting: Family

## 2013-08-10 VITALS — BP 104/62 | HR 89 | Temp 97.8°F | Resp 14 | Ht 60.0 in | Wt 103.0 lb

## 2013-08-10 DIAGNOSIS — N289 Disorder of kidney and ureter, unspecified: Secondary | ICD-10-CM | POA: Diagnosis not present

## 2013-08-10 DIAGNOSIS — Z8659 Personal history of other mental and behavioral disorders: Secondary | ICD-10-CM

## 2013-08-10 DIAGNOSIS — Z23 Encounter for immunization: Secondary | ICD-10-CM

## 2013-08-10 DIAGNOSIS — E039 Hypothyroidism, unspecified: Secondary | ICD-10-CM

## 2013-08-10 DIAGNOSIS — N189 Chronic kidney disease, unspecified: Secondary | ICD-10-CM

## 2013-08-10 DIAGNOSIS — M810 Age-related osteoporosis without current pathological fracture: Secondary | ICD-10-CM | POA: Diagnosis not present

## 2013-08-10 LAB — BASIC METABOLIC PANEL
CO2: 21 mEq/L (ref 19–32)
Chloride: 105 mEq/L (ref 96–112)
Creat: 1.97 mg/dL — ABNORMAL HIGH (ref 0.50–1.10)
Sodium: 135 mEq/L (ref 135–145)

## 2013-08-10 MED ORDER — FLUOCINONIDE 0.05 % EX GEL: CUTANEOUS | Status: DC

## 2013-08-10 MED ORDER — ALBUTEROL SULFATE HFA 108 (90 BASE) MCG/ACT IN AERS: 2.0000 | INHALATION_SPRAY | Freq: Four times a day (QID) | RESPIRATORY_TRACT | Status: DC | PRN

## 2013-08-10 NOTE — Assessment & Plan Note (Signed)
Continue Synthroid at 22mcg daily.  Follow up in 3 months.

## 2013-08-10 NOTE — Progress Notes (Signed)
Subjective:    Patient ID: Shari Schmitt, female    DOB: [birth year], 72 y.o.   MRN: [id number]  HPI Shari Schmitt is a 72 year old female who presents today for a 3 month follow up.  Psychosis) Patient denies depression, insomnia and stress; reports good appetite, energy.  Denies SI.  Reports she was seen due to marital problems, denies any concerns.  Renal Insufficency) Patient following up with nephrologist at [facility] in early October.  Denies dysuria, hematuria, changes in urination frequency.  Hypothyroidism) Patient taking Synthroid 44mcg as prescribed, denies fatigue, changes in appetite.  Osteoporosis) Patient requesting a vitamin D level.   Review of Systems  Constitutional: Negative for fatigue.  Endocrine: Negative for cold intolerance and heat intolerance.  Psychiatric/Behavioral: Negative for behavioral problems and agitation.   Past Medical History  Diagnosis Date  . Renal insufficiency   . Biliary cirrhosis   . Hypotension   . Anxiety   . Asthma   . Kidney disease   . Diverticulitis   . Pancreas disorder   . Hyperthyroidism   . Dermatitis herpetiformis   . Celiac disease     History   Social History  . Marital Status: Legally Separated    Spouse Name: N/A    Number of Children: 4  . Years of Education: N/A   Occupational History  . retired    Social History Main Topics  . Smoking status: Former Smoker    Types: Cigarettes    Quit date: 11/25/1981  . Smokeless tobacco: Never Used  . Alcohol Use: No  . Drug Use: No  . Sexual Activity: Not on file   Other Topics Concern  . Not on file   Social History Narrative   Retired Engineer, manufacturing systems   5 sons- oldest son deceased due to injury   9 living sons all live locally   She lives alone   Completed the 10th grade   Legally separated   Enjoys gardening.             Past Surgical History  Procedure Laterality Date  . Pancreas surgery  1962    80% removed  . Thyroidectomy  1965     Family History  Problem Relation Age of Onset  . Emphysema Father     deceased  . Heart disease Mother     deceased  . Kidney disease Brother   . Colon cancer Neg Hx     Allergies  Allergen Reactions  . Codeine     Asthma exacerbation  . Gluten Meal Rash    Current Outpatient Prescriptions on File Prior to Visit  Medication Sig Dispense Refill  . ALPRAZolam (XANAX) 0.5 MG tablet TAKE 1 TABLET BY MOUTH TWICE A DAY AS NEEDED  60 tablet  0  . atorvastatin (LIPITOR) 10 MG tablet Take 10 mg by mouth daily.        Marland Kitchen esomeprazole (NEXIUM) 40 MG capsule Take 40 mg by mouth daily before breakfast.        . fexofenadine-pseudoephedrine (ALLEGRA-D) 60-120 MG per tablet Take 1 tablet by mouth daily.       Marland Kitchen levothyroxine (SYNTHROID, LEVOTHROID) 75 MCG tablet Alternate 1 tablet once daily with 1/2 tablet once daily      . Multiple Vitamin (MULTIVITAMIN) tablet Take 1 tablet by mouth daily.        . ursodiol (ACTIGALL) 300 MG capsule Take 1 capsule (300 mg total) by mouth 3 (three) times daily.  270 capsule  1  No current facility-administered medications on file prior to visit.    BP 104/62  Pulse 89  Temp(Src) 97.8 F (36.6 C) (Oral)  Resp 14  Ht 5' (1.524 m)  Wt 103 lb (46.72 kg)  BMI 20.12 kg/m2  SpO2 98%       Objective:   Physical Exam  Constitutional: She is oriented to person, place, and time. She appears well-nourished.  Cardiovascular: Normal rate and regular rhythm.   No murmur heard. Pulmonary/Chest: Breath sounds normal. No respiratory distress.  Neurological: She is alert and oriented to person, place, and time.  Skin: Skin is warm and dry.  Psychiatric: She has a normal mood and affect.          Assessment & Plan:

## 2013-08-10 NOTE — Patient Instructions (Addendum)
Please complete lab work prior to leaving. Follow up in 3 months.  

## 2013-08-10 NOTE — Assessment & Plan Note (Signed)
Stable.  Monitor.  

## 2013-08-10 NOTE — Assessment & Plan Note (Signed)
Patient following up with nephrologist at [facility] in early October.   Will repeat BMET in office today to assess current kidney function.

## 2013-08-11 ENCOUNTER — Telehealth: Payer: Self-pay | Admitting: *Deleted

## 2013-08-11 ENCOUNTER — Encounter: Payer: Self-pay | Admitting: Family

## 2013-08-11 NOTE — Telephone Encounter (Signed)
Pt declined vitamin D testing on 08/10/13 due to the ABN and medicare warning of frequency limit.

## 2013-08-11 NOTE — Telephone Encounter (Signed)
Noted  

## 2013-08-20 ENCOUNTER — Other Ambulatory Visit: Payer: Self-pay | Admitting: Family

## 2013-08-23 NOTE — Telephone Encounter (Signed)
OK to send #60 tabs with zero refills please.

## 2013-08-23 NOTE — Telephone Encounter (Signed)
Alprazolam rx last given 07/19/13, #l60.  Please advise.

## 2013-08-23 NOTE — Telephone Encounter (Signed)
Refill called to pharmacy voicemail. 

## 2013-08-24 ENCOUNTER — Telehealth: Payer: Self-pay | Admitting: *Deleted

## 2013-08-24 MED ORDER — URSODIOL 300 MG PO CAPS: 300.0000 mg | ORAL_CAPSULE | Freq: Three times a day (TID) | ORAL | Status: DC

## 2013-08-24 NOTE — Telephone Encounter (Signed)
Received message from pt requesting refill to [facility] for Actigall. States she tried to call them but was unable to speak with a representative and they still have her previous doctor as the prescriber. Advised her I will send refill and to keep trying until she reaches a representative at [facility] to make them aware of Provider change.

## 2013-09-01 DIAGNOSIS — I200 Unstable angina: Secondary | ICD-10-CM | POA: Diagnosis not present

## 2013-09-01 DIAGNOSIS — I10 Essential (primary) hypertension: Secondary | ICD-10-CM | POA: Diagnosis not present

## 2013-09-01 DIAGNOSIS — N2581 Secondary hyperparathyroidism of renal origin: Secondary | ICD-10-CM | POA: Diagnosis not present

## 2013-09-01 DIAGNOSIS — N184 Chronic kidney disease, stage 4 (severe): Secondary | ICD-10-CM | POA: Diagnosis not present

## 2013-09-01 DIAGNOSIS — D509 Iron deficiency anemia, unspecified: Secondary | ICD-10-CM | POA: Diagnosis not present

## 2013-09-21 ENCOUNTER — Other Ambulatory Visit: Payer: Self-pay | Admitting: Family

## 2013-09-22 NOTE — Telephone Encounter (Signed)
Requested Medications    Medication name:  Name from pharmacy:  ALPRAZolam (XANAX) 0.5 MG tablet  ALPRAZOLAM 0.5 MG TABLET   Sig: TAKE 1 TABLET TWICE DAILY AS NEEDED   Dispense: 60 tablet Refills: 0 Start: 09/21/2013  Class: Normal   Requested on: 08/23/2013   Originally ordered on: 03/05/2011 Last refill: 08/23/2013

## 2013-09-23 NOTE — Telephone Encounter (Signed)
Would you please advise in PCP absence, pt has called/SLS Thanks.

## 2013-09-23 NOTE — Telephone Encounter (Signed)
approved

## 2013-09-24 ENCOUNTER — Other Ambulatory Visit: Payer: Self-pay | Admitting: Family

## 2013-09-24 NOTE — Telephone Encounter (Signed)
RX faxed

## 2013-09-24 NOTE — Telephone Encounter (Signed)
Rx request Denied; Rx Faxed to pharmacy 10.28.14, #60x0/SLS

## 2013-10-18 ENCOUNTER — Other Ambulatory Visit: Payer: Self-pay | Admitting: Family

## 2013-10-18 ENCOUNTER — Other Ambulatory Visit: Payer: Self-pay | Admitting: Family Medicine

## 2013-10-18 NOTE — Telephone Encounter (Signed)
Please advise refill? Last RX was 09-21-13 quantity 60 with 0 refills

## 2013-10-18 NOTE — Telephone Encounter (Signed)
eScribe request for refill on Fluocinonide gel 0.05% gel Last filled - 09.16.14. #60g x 0 Last AEX - 09.16.14 Next AEX - 3 Months Please Advise/SLS

## 2013-10-22 ENCOUNTER — Telehealth: Payer: Self-pay | Admitting: *Deleted

## 2013-10-22 NOTE — Telephone Encounter (Signed)
Rx request phoned to pharmacy/SLS  

## 2013-10-22 NOTE — Telephone Encounter (Signed)
Faxed refill request received from pharmacy for Alprazolam Last filled by MD on 08.28.14, #60x0 Last AEX - 09.16.14 Next AEX - 3 Months Please Advise/SLS

## 2013-10-22 NOTE — Telephone Encounter (Signed)
OK to send #60 with zero refills. 

## 2013-10-22 NOTE — Telephone Encounter (Signed)
Debbrah Alar, NP at 10/22/2013 12:37 PM    Status: Signed       OK to send #60 with zero refills.        Rockwell Germany, CMA at 10/22/2013 9:10 AM     Status: Signed        Faxed refill request received from pharmacy for Alprazolam  Last filled by MD on 08.28.14, #60x0  Last AEX - 09.16.14  Next AEX - 3 Months  Please Advise/SLS

## 2013-11-12 ENCOUNTER — Ambulatory Visit (INDEPENDENT_AMBULATORY_CARE_PROVIDER_SITE_OTHER): Payer: Medicare Other | Admitting: Family

## 2013-11-12 VITALS — BP 112/64 | HR 100 | Temp 97.6°F | Wt 105.0 lb

## 2013-11-12 DIAGNOSIS — N189 Chronic kidney disease, unspecified: Secondary | ICD-10-CM | POA: Diagnosis not present

## 2013-11-12 DIAGNOSIS — E039 Hypothyroidism, unspecified: Secondary | ICD-10-CM | POA: Diagnosis not present

## 2013-11-12 DIAGNOSIS — F411 Generalized anxiety disorder: Secondary | ICD-10-CM | POA: Diagnosis not present

## 2013-11-12 NOTE — Assessment & Plan Note (Signed)
Stable on synthroid, continue same.   ?

## 2013-11-12 NOTE — Patient Instructions (Signed)
Please follow up in 3 months.  

## 2013-11-12 NOTE — Assessment & Plan Note (Signed)
Clinically stable, pt instructed to keep upcoming follow up apt with nephrology.

## 2013-11-12 NOTE — Progress Notes (Signed)
Subjective:    Patient ID: Shari Schmitt, female    DOB: [birth year], 73 y.o.   MRN: [id number]  HPI  Shari Schmitt is a 72 yr old female who presents today for follow up. She has no new concerns.    Renal insufficiency- saw nephrology in October and has follow up arranged in February.  She continues synthroid, feels well on that dose.  Anxiety- Denies current depression symptoms.  Reports she is filing for divorce and is feeling glad about it.      Review of Systems See HPI  Past Medical History  Diagnosis Date  . Renal insufficiency   . Biliary cirrhosis   . Hypotension   . Anxiety   . Asthma   . Kidney disease   . Diverticulitis   . Pancreas disorder   . Hyperthyroidism   . Dermatitis herpetiformis   . Celiac disease     History   Social History  . Marital Status: Legally Separated    Spouse Name: N/A    Number of Children: 4  . Years of Education: N/A   Occupational History  . retired    Social History Main Topics  . Smoking status: Former Smoker    Types: Cigarettes    Quit date: 11/25/1981  . Smokeless tobacco: Never Used  . Alcohol Use: No  . Drug Use: No  . Sexual Activity: Not on file   Other Topics Concern  . 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             Past Surgical History  Procedure Laterality Date  . Pancreas surgery  1962    80% removed  . Thyroidectomy  1965    Family History  Problem Relation Age of Onset  . Emphysema Father     deceased  . Heart disease Mother     deceased  . Kidney disease Brother   . Colon cancer Neg Hx     Allergies  Allergen Reactions  . Codeine     Asthma exacerbation  . Gluten Meal Rash    Current Outpatient Prescriptions on File Prior to Visit  Medication Sig Dispense Refill  . albuterol  (PROAIR HFA) 108 (90 BASE) MCG/ACT inhaler Inhale 2 puffs into the lungs every 6 (six) hours as needed.  1 Inhaler  2  . ALPRAZolam (XANAX) 0.5 MG tablet TAKE 1 TABLET BY MOUTH TWICE A DAY AS NEEDED  60 tablet  0  . atorvastatin (LIPITOR) 10 MG tablet Take 10 mg by mouth daily.        Marland Kitchen esomeprazole (NEXIUM) 40 MG capsule Take 40 mg by mouth daily before breakfast.        . fexofenadine-pseudoephedrine (ALLEGRA-D) 60-120 MG per tablet Take 1 tablet by mouth daily.       . fluocinonide gel (LIDEX) 0.05 % APPLY AS NEEDED AS DIRECTED  60 g  0  . fluocinonide gel (LIDEX) 0.05 % APPLY AS NEEDED AS DIRECTED  60 g  0  . levothyroxine (SYNTHROID, LEVOTHROID) 75 MCG tablet TAKE 1 TABLET EVERY DAY  30 tablet  3  . Multiple Vitamin (MULTIVITAMIN) tablet Take 1 tablet by mouth daily.        . ursodiol (ACTIGALL) 300 MG capsule  Take 1 capsule (300 mg total) by mouth 3 (three) times daily.  270 capsule  1   No current facility-administered medications on file prior to visit.    BP 112/64  Pulse 100  Temp(Src) 97.6 F (36.4 C)  Wt 105 lb (47.628 kg)  SpO2 100%       Objective:   Physical Exam  Constitutional: She is oriented to person, place, and time. She appears well-developed and well-nourished. No distress.  HENT:  Head: Normocephalic and atraumatic.  Cardiovascular: Normal rate and regular rhythm.   No murmur heard. Pulmonary/Chest: Effort normal and breath sounds normal. No respiratory distress. She has no wheezes. She has no rales. She exhibits no tenderness.  Musculoskeletal: She exhibits no edema.  Neurological: She is alert and oriented to person, place, and time.  Psychiatric: She has a normal mood and affect. Her behavior is normal. Judgment and thought content normal.          Assessment & Plan:

## 2013-11-12 NOTE — Assessment & Plan Note (Signed)
Stable.  Monitor.  

## 2013-11-19 ENCOUNTER — Other Ambulatory Visit: Payer: Self-pay | Admitting: Family

## 2013-11-19 NOTE — Telephone Encounter (Signed)
Please advise the refill for Shari Schmitt? Last RX was wrote on 10-18-13 quantity 60 with 0 refills  Last OV was 11-12-13  If ok fax to [phone]

## 2013-11-19 NOTE — Telephone Encounter (Signed)
RX faxed

## 2013-12-19 ENCOUNTER — Other Ambulatory Visit: Payer: Self-pay | Admitting: Family

## 2013-12-19 ENCOUNTER — Other Ambulatory Visit: Payer: Self-pay | Admitting: Family Medicine

## 2013-12-20 NOTE — Telephone Encounter (Signed)
OK to send #60 with zero refills. 

## 2013-12-20 NOTE — Telephone Encounter (Signed)
Please advise refill? Last RX was done on 11-19-13 quantity 60 with 0 refills  If ok fax to [phone]

## 2013-12-20 NOTE — Telephone Encounter (Signed)
Rx called to pharmacy voicemail. 

## 2014-01-03 DIAGNOSIS — N250 Renal osteodystrophy: Secondary | ICD-10-CM | POA: Diagnosis not present

## 2014-01-03 DIAGNOSIS — I959 Hypotension, unspecified: Secondary | ICD-10-CM | POA: Diagnosis not present

## 2014-01-03 DIAGNOSIS — D638 Anemia in other chronic diseases classified elsewhere: Secondary | ICD-10-CM | POA: Diagnosis not present

## 2014-01-03 DIAGNOSIS — N183 Chronic kidney disease, stage 3 unspecified: Secondary | ICD-10-CM | POA: Diagnosis not present

## 2014-01-10 ENCOUNTER — Other Ambulatory Visit: Payer: Self-pay | Admitting: Family

## 2014-01-17 ENCOUNTER — Other Ambulatory Visit: Payer: Self-pay | Admitting: Family Medicine

## 2014-01-17 ENCOUNTER — Other Ambulatory Visit: Payer: Self-pay | Admitting: Family

## 2014-01-17 NOTE — Telephone Encounter (Signed)
OK to send 60 tabs, zero refills.  

## 2014-01-17 NOTE — Telephone Encounter (Signed)
Pt has f/u 02/07/14.  Please advise:  Medication name:  Name from pharmacy:  ALPRAZolam (XANAX) 0.5 MG tablet  ALPRAZOLAM 0.5 MG TABLET Sig: TAKE 1 TABLET 3 TIMES A DAY Dispense: 60 tablet Refills: 0 Start: 01/17/2014 Class: Normal Requested on: 12/20/2013 Originally ordered on: 03/05/2011 Last refill: 12/20/2013

## 2014-01-18 NOTE — Telephone Encounter (Signed)
Rx called to pharmacy voicemail. 

## 2014-01-28 ENCOUNTER — Other Ambulatory Visit: Payer: Self-pay | Admitting: Family

## 2014-02-01 ENCOUNTER — Ambulatory Visit (INDEPENDENT_AMBULATORY_CARE_PROVIDER_SITE_OTHER): Payer: Medicare Other | Admitting: Internal Medicine

## 2014-02-01 VITALS — BP 132/58 | HR 91 | Temp 97.5°F | Resp 16 | Ht 61.0 in | Wt 105.0 lb

## 2014-02-01 DIAGNOSIS — L240 Irritant contact dermatitis due to detergents: Secondary | ICD-10-CM

## 2014-02-01 DIAGNOSIS — L259 Unspecified contact dermatitis, unspecified cause: Secondary | ICD-10-CM

## 2014-02-01 DIAGNOSIS — L299 Pruritus, unspecified: Secondary | ICD-10-CM | POA: Diagnosis not present

## 2014-02-01 MED ORDER — CLOBETASOL PROPIONATE 0.05 % EX CREA: 1.0000 "application " | TOPICAL_CREAM | Freq: Two times a day (BID) | CUTANEOUS | Status: DC

## 2014-02-01 MED ORDER — CETIRIZINE HCL 10 MG PO TABS: 10.0000 mg | ORAL_TABLET | Freq: Every day | ORAL | Status: DC
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
## 2014-02-01 NOTE — Patient Instructions (Signed)

## 2014-02-07 ENCOUNTER — Encounter: Payer: Self-pay | Admitting: Family

## 2014-02-07 ENCOUNTER — Ambulatory Visit (INDEPENDENT_AMBULATORY_CARE_PROVIDER_SITE_OTHER): Payer: Medicare Other | Admitting: Family

## 2014-02-07 VITALS — BP 110/60 | HR 86 | Temp 97.9°F | Resp 16 | Ht 60.0 in | Wt 108.0 lb

## 2014-02-07 DIAGNOSIS — L309 Dermatitis, unspecified: Secondary | ICD-10-CM | POA: Insufficient documentation

## 2014-02-07 DIAGNOSIS — Z8659 Personal history of other mental and behavioral disorders: Secondary | ICD-10-CM | POA: Diagnosis not present

## 2014-02-07 DIAGNOSIS — L259 Unspecified contact dermatitis, unspecified cause: Secondary | ICD-10-CM

## 2014-02-07 DIAGNOSIS — N189 Chronic kidney disease, unspecified: Secondary | ICD-10-CM

## 2014-02-07 DIAGNOSIS — F411 Generalized anxiety disorder: Secondary | ICD-10-CM

## 2014-02-07 DIAGNOSIS — E039 Hypothyroidism, unspecified: Secondary | ICD-10-CM

## 2014-02-07 MED ORDER — PREDNISONE 10 MG PO TABS: ORAL_TABLET | ORAL | Status: DC

## 2014-02-07 NOTE — Patient Instructions (Signed)
Please start prednisone for rash. Call if symptoms worsen or if symptoms do not improve in 2-3  Complete lab work prior to leaving. Follow up in 3 months.
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
## 2014-02-07 NOTE — Assessment & Plan Note (Signed)
Pt reports that she has no anxiety and no stress.

## 2014-02-07 NOTE — Assessment & Plan Note (Signed)
Had recent bmet with nephrology. Will request lab results.

## 2014-02-07 NOTE — Assessment & Plan Note (Signed)
Will plan short course of prednisone 40 mg once daily for 4 days.

## 2014-02-07 NOTE — Progress Notes (Signed)
Pre visit review using our clinic review tool, if applicable. No additional management support is needed unless otherwise documented below in the visit note. 

## 2014-02-07 NOTE — Assessment & Plan Note (Signed)
Obtain TSH °

## 2014-02-08 ENCOUNTER — Encounter: Payer: Self-pay | Admitting: Family

## 2014-02-08 LAB — TSH: TSH: 0.856 u[IU]/mL (ref 0.350–4.500)

## 2014-02-09 ENCOUNTER — Telehealth: Payer: Self-pay | Admitting: *Deleted

## 2014-02-09 NOTE — Telephone Encounter (Signed)
Received call from pt wanting to verify prednisone dose. States that she normally does a taper for her asthma and this is 4 tabs once a day for 4 days.  Verified with Provider and advised pt that current directions are correct for her current indication.

## 2014-02-16 ENCOUNTER — Other Ambulatory Visit: Payer: Self-pay | Admitting: Family

## 2014-02-16 NOTE — Telephone Encounter (Signed)
Ok to send 60 tabs zero refills.

## 2014-02-16 NOTE — Telephone Encounter (Signed)
Please advise refill:  Medication name:  Name from pharmacy:  ALPRAZolam (XANAX) 0.5 MG tablet  ALPRAZOLAM 0.5 MG TABLET Sig: TAKE 1 TABLET 3 TIMES A DAY IF NEEDED Dispense: 60 tablet Refills: 0 Start: 02/16/2014 Class: Normal Requested on: 01/18/2014 Originally ordered on: 03/05/2011 Last refill: 01/18/2014

## 2014-02-18 NOTE — Telephone Encounter (Signed)
Rx called to pharmacy voicemail as below. 

## 2014-02-21 ENCOUNTER — Telehealth: Payer: Self-pay | Admitting: *Deleted

## 2014-02-21 NOTE — Telephone Encounter (Signed)
Message copied by Ronny Flurry on Mon Feb 21, 2014  4:00 PM ------      Message from: O'SULLIVAN, MELISSA      Created: Mon Feb 07, 2014  1:28 PM       Could you pls request lab work from Dr. Birdie Riddle office? thanks ------

## 2014-02-21 NOTE — Telephone Encounter (Signed)
Requested records from [facility], at [phone]. Awaiting results.

## 2014-02-22 NOTE — Telephone Encounter (Signed)
Records received and forwarded to Provider for review.

## 2014-03-17 ENCOUNTER — Other Ambulatory Visit: Payer: Self-pay | Admitting: Family

## 2014-03-17 NOTE — Telephone Encounter (Signed)
eScribe request from [facility] for refill on Alprazolam 0.5 mg Last filled - 03.27.15, #60x0 eScribe request from [facility] for refill on Fluocinonide gel 0.05% Last filled - 09.16.14, #60gx0 Last AEX - 03.16.15 Next AEX - 3-Mths Please Advise on refills/SLS

## 2014-03-18 NOTE — Telephone Encounter (Signed)
Rx request phoned to pharmacy/SLS  

## 2014-03-18 NOTE — Telephone Encounter (Signed)
OK to send refills as below.  

## 2014-04-14 ENCOUNTER — Other Ambulatory Visit: Payer: Self-pay | Admitting: Family

## 2014-04-15 NOTE — Telephone Encounter (Signed)
Please advise:  Medication name:  Name from pharmacy:  fluocinonide gel (LIDEX) 0.05 %  FLUOCINONIDE 0.05% GEL Sig: APPLY TO AFFECTED AREA AS NEEDED Dispense: 60 g Refills: 0 Start: 04/14/2014 Class: Normal Requested on: 03/18/2014 Originally ordered on: 02/01/2013 Last refill: 03/18/2014 Order History and Details  Medication name:  Name from pharmacy:  ALPRAZolam (XANAX) 0.5 MG tablet  ALPRAZOLAM 0.5 MG TABLET Sig: TAKE 1 TABLET 3 TIMES A DAY AS NEEDED Dispense: 60 tablet Refills: 0 Start: 04/14/2014 Class: Normal Requested on: 03/18/2014 Originally ordered on: 03/05/2011 Last refill: 03/18/2014

## 2014-04-15 NOTE — Telephone Encounter (Signed)
Refill sent and alprazolam rx called to pharmacy voicemail.

## 2014-04-15 NOTE — Telephone Encounter (Signed)
ok 

## 2014-05-17 ENCOUNTER — Other Ambulatory Visit: Payer: Self-pay | Admitting: Family

## 2014-05-18 ENCOUNTER — Encounter: Payer: Self-pay | Admitting: Family

## 2014-05-18 ENCOUNTER — Ambulatory Visit (INDEPENDENT_AMBULATORY_CARE_PROVIDER_SITE_OTHER): Payer: Medicare Other | Admitting: Family

## 2014-05-18 VITALS — BP 100/54 | HR 86 | Temp 97.8°F | Ht 60.0 in | Wt 106.2 lb

## 2014-05-18 DIAGNOSIS — E785 Hyperlipidemia, unspecified: Secondary | ICD-10-CM | POA: Diagnosis not present

## 2014-05-18 DIAGNOSIS — N189 Chronic kidney disease, unspecified: Secondary | ICD-10-CM | POA: Diagnosis not present

## 2014-05-18 DIAGNOSIS — F411 Generalized anxiety disorder: Secondary | ICD-10-CM

## 2014-05-18 DIAGNOSIS — Z91018 Allergy to other foods: Secondary | ICD-10-CM | POA: Diagnosis not present

## 2014-05-18 DIAGNOSIS — E039 Hypothyroidism, unspecified: Secondary | ICD-10-CM

## 2014-05-18 LAB — LIPID PANEL
CHOL/HDL RATIO: 2.9 ratio
CHOLESTEROL: 173 mg/dL (ref 0–200)
HDL: 60 mg/dL (ref >39)
LDL CALC: 89 mg/dL (ref 0–99)
TRIGLYCERIDES: 118 mg/dL (ref <150)
VLDL: 24 mg/dL (ref 0–40)

## 2014-05-18 MED ORDER — LEVOTHYROXINE SODIUM 75 MCG PO TABS: ORAL_TABLET | ORAL | Status: DC

## 2014-05-18 MED ORDER — ATORVASTATIN CALCIUM 10 MG PO TABS: 10.0000 mg | ORAL_TABLET | Freq: Every day | ORAL | Status: DC

## 2014-05-18 MED ORDER — ESOMEPRAZOLE MAGNESIUM 40 MG PO CPDR: 40.0000 mg | DELAYED_RELEASE_CAPSULE | Freq: Every day | ORAL | Status: DC

## 2014-05-18 MED ORDER — ALPRAZOLAM 0.5 MG PO TABS: ORAL_TABLET | ORAL | Status: DC

## 2014-05-18 NOTE — Patient Instructions (Signed)
Please complete lab work prior to leaving. Please schedule a follow up appointment in 6 months.

## 2014-05-18 NOTE — Progress Notes (Signed)
Subjective:    Patient ID: Shari Schmitt, female    DOB: 1941/09/17, 73 y.o.   MRN: [id number]  HPI  Shari Schmitt is a 73 yr old female who presents today for follow up.  Renal Insufficiency- she will see nephrology in August.    Anxiety- Reports anxiety is well controlled. She takes xanax twice daily.   Hypothyroid-reports feeling well on current dose of synthroid.  Hyperlipidemia- Tries to eat a low cholesterol diet. Denies myalgia.   Review of Systems See HPI  Past Medical History  Diagnosis Date  . Renal insufficiency   . Biliary cirrhosis   . Hypotension   . Anxiety   . Asthma   . Kidney disease   . Diverticulitis   . Pancreas disorder   . Hyperthyroidism   . Dermatitis herpetiformis   . Celiac disease     History   Social History  . Marital Status: Legally Separated    Spouse Name: N/A    Number of Children: 4  . Years of Education: N/A   Occupational History  . retired    Social History Main Topics  . Smoking status: Former Smoker    Types: Cigarettes    Quit date: 11/25/1981  . Smokeless tobacco: Never Used  . Alcohol Use: No  . Drug Use: No  . Sexual Activity: Not on file   Other Topics Concern  . Not on file   Social History Narrative   Retired Engineer, manufacturing systems   5 sons- oldest son deceased due to injury   73 living sons all live locally   She lives alone   Completed the 10th grade   Legally separated   Enjoys gardening.             Past Surgical History  Procedure Laterality Date  . Pancreas surgery  1962    80% removed  . Thyroidectomy  1965    Family History  Problem Relation Age of Onset  . Emphysema Father     deceased  . Heart disease Mother     deceased  . Kidney disease Brother   . Colon cancer Neg Hx     Allergies  Allergen Reactions  . Codeine     Asthma exacerbation  . Gluten Meal Rash    Current Outpatient Prescriptions on File Prior to Visit  Medication Sig Dispense Refill  . albuterol (PROAIR HFA)  108 (90 BASE) MCG/ACT inhaler Inhale 2 puffs into the lungs every 6 (six) hours as needed.  1 Inhaler  2  . ALPRAZolam (XANAX) 0.5 MG tablet TAKE 1 TABLET 3 TIMES A DAY AS NEEDED  60 tablet  0  . atorvastatin (LIPITOR) 10 MG tablet Take 10 mg by mouth daily.        . cetirizine (ZYRTEC) 10 MG tablet Take 1 tablet (10 mg total) by mouth daily.  30 tablet  0  . esomeprazole (NEXIUM) 40 MG capsule Take 40 mg by mouth daily before breakfast.        . fexofenadine-pseudoephedrine (ALLEGRA-D) 60-120 MG per tablet One tablet twice daily as needed  60 tablet  6  . levothyroxine (SYNTHROID, LEVOTHROID) 75 MCG tablet TAKE 1 TABLET BY MOUTH EVERY DAY  30 tablet  3  . Multiple Vitamin (MULTIVITAMIN) tablet Take 1 tablet by mouth daily.        . ursodiol (ACTIGALL) 300 MG capsule TAKE 1 CAPSULE THREE TIMES A DAY  270 capsule  1   No current facility-administered medications on file  prior to visit.    BP 100/54  Pulse 86  Temp(Src) 97.8 F (36.6 C) (Oral)  Ht 5' (1.524 m)  Wt 106 lb 4 oz (48.195 kg)  BMI 20.75 kg/m2  SpO2 98%       Objective:   Physical Exam  Constitutional: She is oriented to person, place, and time. She appears well-developed and well-nourished. No distress.  HENT:  Head: Normocephalic and atraumatic.  Cardiovascular: Normal rate and regular rhythm.   No murmur heard. Pulmonary/Chest: Effort normal and breath sounds normal. No respiratory distress. She has no wheezes. She has no rales. She exhibits no tenderness.  Musculoskeletal: She exhibits no edema.  Neurological: She is alert and oriented to person, place, and time.  Psychiatric: She has a normal mood and affect. Her behavior is normal. Judgment and thought content normal.          Assessment & Plan:

## 2014-05-18 NOTE — Progress Notes (Signed)
Pre visit review using our clinic review tool, if applicable. No additional management support is needed unless otherwise documented below in the visit note. 

## 2014-05-19 ENCOUNTER — Encounter: Payer: Self-pay | Admitting: Family

## 2014-05-20 ENCOUNTER — Telehealth: Payer: Self-pay | Admitting: *Deleted

## 2014-05-20 ENCOUNTER — Other Ambulatory Visit: Payer: Self-pay | Admitting: Family

## 2014-05-20 NOTE — Telephone Encounter (Signed)
Received call from pt that pharmacy did not received alprazolam refill. Spoke with pharmacist, last refill was #60 on 04/15/14. Gave verbal for #60 x no refills per 05/18/14 office note.

## 2014-05-22 NOTE — Assessment & Plan Note (Signed)
Lab Results  Component Value Date   TSH 0.856 02/07/2014   Clinically stable on current dose of synthroid, continue same.

## 2014-05-22 NOTE — Assessment & Plan Note (Signed)
Lab Results  Component Value Date   CHOL 173 05/18/2014   HDL 60 05/18/2014   LDLCALC 89 05/18/2014   TRIG 118 05/18/2014   CHOLHDL 2.9 05/18/2014   Lipid panel is stable. Tolerating low dose lipitor.  Continue same.

## 2014-05-22 NOTE — Assessment & Plan Note (Signed)
Advised pt to keep upcoming appointment with nephrology in August.

## 2014-05-22 NOTE — Assessment & Plan Note (Signed)
Well controlled on bid xanax, continue same.

## 2014-05-23 NOTE — Telephone Encounter (Signed)
Pt has follow up on 11/14/14.  Please advise refill:  Medication name:  Name from pharmacy:  ALPRAZolam (XANAX) 0.5 MG tablet  ALPRAZOLAM 0.5 MG TABLET Sig: TAKE 1 TABLET BY MOUTH 3 TIMES A DAY AS NEEDED Dispense: 60 tablet Refills: 0 Start: 05/20/2014 Class: Normal Requested on: 04/15/2014 Originally ordered on: 03/05/2011 Last refill: 04/15/2014

## 2014-05-23 NOTE — Telephone Encounter (Signed)
OK to send #60 tabs zero refills.  

## 2014-05-23 NOTE — Telephone Encounter (Signed)
Refill called to pharmacy voicemail. 

## 2014-06-15 ENCOUNTER — Other Ambulatory Visit: Payer: Self-pay | Admitting: Family

## 2014-06-15 NOTE — Telephone Encounter (Signed)
OK to do 30 tabs zero refills.

## 2014-06-18 ENCOUNTER — Other Ambulatory Visit: Payer: Self-pay | Admitting: Family

## 2014-06-20 NOTE — Telephone Encounter (Signed)
Refill request for fluocinonide gel Last filled by MD on- 12/19/13 60g x0 Last Appt: 05/18/2014 Next Appt: 11/14/2014 Please advise refill?

## 2014-07-04 ENCOUNTER — Other Ambulatory Visit: Payer: Self-pay | Admitting: Family

## 2014-07-04 NOTE — Telephone Encounter (Signed)
RX SENT TO PHARMACY/LDM

## 2014-07-17 ENCOUNTER — Other Ambulatory Visit: Payer: Self-pay | Admitting: Family

## 2014-07-20 NOTE — Telephone Encounter (Signed)
Lrd 06/15/14 Lov 05/18/2014  LDM

## 2014-07-21 MED ORDER — ALPRAZOLAM 0.5 MG PO TABS: 0.5000 mg | ORAL_TABLET | Freq: Three times a day (TID) | ORAL | Status: DC | PRN

## 2014-07-21 NOTE — Telephone Encounter (Signed)
OK to send #60 tabs zero refills.  

## 2014-08-18 ENCOUNTER — Other Ambulatory Visit: Payer: Self-pay | Admitting: Family

## 2014-08-18 ENCOUNTER — Other Ambulatory Visit: Payer: Self-pay | Admitting: Family Medicine

## 2014-08-19 NOTE — Telephone Encounter (Signed)
Rx printed and forwarded to Provider for signature.  Medication name:  Name from pharmacy:  ALPRAZolam (XANAX) 0.5 MG tablet  ALPRAZOLAM 0.5 MG TABLET Sig: TAKE 1 TABLET BY MOUTH THREE TIMES A DAY AS NEEDED FOR ANXIETY Dispense: 60 tablet Refills: 0 Start: 08/18/2014 Class: Normal Notes to pharmacy: Not to exceed 6 additional fills before 01/17/2015 Requested on: 07/21/2014 Originally ordered on: 03/05/2011 Last refill: 07/21/2014

## 2014-08-19 NOTE — Telephone Encounter (Signed)
Rx faxed to pharmacy  

## 2014-08-23 DIAGNOSIS — Z23 Encounter for immunization: Secondary | ICD-10-CM | POA: Diagnosis not present

## 2014-08-25 DIAGNOSIS — I959 Hypotension, unspecified: Secondary | ICD-10-CM | POA: Diagnosis not present

## 2014-08-25 DIAGNOSIS — N250 Renal osteodystrophy: Secondary | ICD-10-CM | POA: Diagnosis not present

## 2014-08-25 DIAGNOSIS — N183 Chronic kidney disease, stage 3 (moderate): Secondary | ICD-10-CM | POA: Diagnosis not present

## 2014-08-25 DIAGNOSIS — D638 Anemia in other chronic diseases classified elsewhere: Secondary | ICD-10-CM | POA: Diagnosis not present

## 2014-09-18 ENCOUNTER — Other Ambulatory Visit: Payer: Self-pay | Admitting: Family

## 2014-09-19 NOTE — Telephone Encounter (Signed)
Alprazolam 0.5mg ; 1 tablet three times a day if needed for anxiety. #60 x no refills called to pharmacy voicemail.

## 2014-10-02 ENCOUNTER — Other Ambulatory Visit: Payer: Self-pay | Admitting: Family

## 2014-10-18 ENCOUNTER — Other Ambulatory Visit: Payer: Self-pay | Admitting: Family

## 2014-10-18 NOTE — Telephone Encounter (Signed)
Received letter from [facility] re: Fluocinonide was discontinued.  See rx for betamethasone- please contact pt and let her know I am sending. Where does she usually apply? thanks

## 2014-10-19 ENCOUNTER — Other Ambulatory Visit: Payer: Self-pay | Admitting: Family

## 2014-10-19 MED ORDER — BETAMETHASONE DIPROPIONATE 0.05 % EX CREA: TOPICAL_CREAM | Freq: Every day | CUTANEOUS | Status: DC

## 2014-10-19 NOTE — Telephone Encounter (Signed)
Spoke with pt. She states she uses this as needed for breakouts on arms, back and legs due to a gluten allergy. Rx sent.

## 2014-10-19 NOTE — Telephone Encounter (Signed)
Alprazolam 0.5mg , #60 x no refills called to pharmacy voicemail. Pt has f/u 10/2014.

## 2014-10-22 ENCOUNTER — Other Ambulatory Visit: Payer: Self-pay | Admitting: Family

## 2014-11-14 ENCOUNTER — Telehealth: Payer: Self-pay | Admitting: Family

## 2014-11-14 ENCOUNTER — Encounter: Payer: Self-pay | Admitting: Family

## 2014-11-14 ENCOUNTER — Ambulatory Visit (INDEPENDENT_AMBULATORY_CARE_PROVIDER_SITE_OTHER): Payer: Medicare Other | Admitting: Family

## 2014-11-14 VITALS — BP 118/60 | HR 78 | Temp 97.8°F | Resp 16 | Ht 60.0 in | Wt 106.6 lb

## 2014-11-14 DIAGNOSIS — E785 Hyperlipidemia, unspecified: Secondary | ICD-10-CM

## 2014-11-14 DIAGNOSIS — E039 Hypothyroidism, unspecified: Secondary | ICD-10-CM

## 2014-11-14 DIAGNOSIS — N183 Chronic kidney disease, stage 3 (moderate): Secondary | ICD-10-CM | POA: Diagnosis not present

## 2014-11-14 DIAGNOSIS — Z23 Encounter for immunization: Secondary | ICD-10-CM | POA: Diagnosis not present

## 2014-11-14 DIAGNOSIS — F411 Generalized anxiety disorder: Secondary | ICD-10-CM | POA: Diagnosis not present

## 2014-11-14 DIAGNOSIS — Z79899 Other long term (current) drug therapy: Secondary | ICD-10-CM | POA: Diagnosis not present

## 2014-11-14 NOTE — Progress Notes (Signed)
Subjective:    Patient ID: Shari Schmitt, female    DOB: 03-25-1941, 73 y.o.   MRN: [id number]  HPI  Shari Schmitt is a 73 yr old female who presents today for follow up.  1) Hyperlipidemia- Patient is currently maintained on the following medication for hyperlipidemia: lipitor Last lipid panel as follows:  Lab Results  Component Value Date   CHOL 173 05/18/2014   HDL 60 05/18/2014   LDLCALC 89 05/18/2014   TRIG 118 05/18/2014   CHOLHDL 2.9 05/18/2014   Patient denies myalgia. Patient reports good compliance with low fat/low cholesterol diet.   Hypothyroid-  Maintained on levothyroxine 73mcg. Lab Results  Component Value Date   TSH 0.856 02/07/2014   Anxiety-  Maintained on xanax.  She uses at night for sleep and as needed for anxiety during the day which is "once in a while."  Last dose was last night.   Chronic renal disease- she continues to follow with Dr. Arty Baumgartner and tells me that he has "downgraded" her from stage IV to stage III which she is pleased about.   Review of Systems See HPI  Past Medical History  Diagnosis Date  . Renal insufficiency   . Biliary cirrhosis   . Hypotension   . Anxiety   . Asthma   . Kidney disease   . Diverticulitis   . Pancreas disorder   . Hyperthyroidism   . Dermatitis herpetiformis   . Celiac disease     History   Social History  . Marital Status: Legally Separated    Spouse Name: N/A    Number of Children: 4  . Years of Education: N/A   Occupational History  . retired    Social History Main Topics  . Smoking status: Former Smoker    Types: Cigarettes    Quit date: 11/25/1981  . Smokeless tobacco: Never Used  . Alcohol Use: No  . Drug Use: No  . Sexual Activity: Not on file   Other Topics Concern  . 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             Past Surgical History  Procedure Laterality Date  . Pancreas surgery  1962    80% removed  . Thyroidectomy  1965    Family History  Problem Relation Age of Onset  . Emphysema Father     deceased  . Heart disease Mother     deceased  . Kidney disease Brother   . Colon cancer Neg Hx     Allergies  Allergen Reactions  . Codeine     Asthma exacerbation  . Gluten Meal Rash    Current Outpatient Prescriptions on File Prior to Visit  Medication Sig Dispense Refill  . albuterol (PROAIR HFA) 108 (90 BASE) MCG/ACT inhaler Inhale 2 puffs into the lungs every 6 (six) hours as needed. 1 Inhaler 2  . ALPRAZolam (XANAX) 0.5 MG tablet TAKE 1 TABLET BY MOUTH 3 TIMES A DAY 60 tablet 0  . atorvastatin (LIPITOR) 10 MG tablet TAKE 1 TABLET DAILY 90 tablet 1  . cetirizine (ZYRTEC) 10 MG tablet Take 1 tablet (10 mg total) by mouth daily. 30 tablet 0  . esomeprazole (NEXIUM) 40 MG capsule TAKE 1 CAPSULE DAILY  BEFORE BREAKFAST 90 capsule 1  . fexofenadine-pseudoephedrine (ALLEGRA-D) 60-120 MG per tablet One tablet twice daily as needed 60 tablet 6  . fluocinonide gel (LIDEX) 0.05 % APPLY TO AFFECTED AREA AS NEEDED 60 g 2  . levothyroxine (SYNTHROID, LEVOTHROID) 75 MCG tablet TAKE 1 TABLET BY MOUTH EVERY DAY 30 tablet 5  . Multiple Vitamin (MULTIVITAMIN) tablet Take 1 tablet by mouth daily.      . ursodiol (ACTIGALL) 300 MG capsule TAKE 1 CAPSULE THREE TIMES A DAY 270 capsule 0   No current facility-administered medications on file prior to visit.    BP 118/60 mmHg  Pulse 78  Temp(Src) 97.8 F (36.6 C) (Oral)  Resp 16  Ht 5' (1.524 m)  Wt 106 lb 9.6 oz (48.353 kg)  BMI 20.82 kg/m2  SpO2 99%       Objective:   Physical Exam  Constitutional: She is oriented to person, place, and time. She appears well-developed and well-nourished. No distress.  Cardiovascular: Normal rate and regular rhythm.   No murmur heard. Pulmonary/Chest: Effort normal and  breath sounds normal. No respiratory distress. She has no wheezes. She has no rales. She exhibits no tenderness.  Musculoskeletal: She exhibits no edema.  Neurological: She is alert and oriented to person, place, and time.  Psychiatric: She has a normal mood and affect. Her behavior is normal. Judgment and thought content normal.          Assessment & Plan:

## 2014-11-14 NOTE — Assessment & Plan Note (Signed)
Clinically stable on synthroid, continue same. Obtain tsh.

## 2014-11-14 NOTE — Assessment & Plan Note (Signed)
Management per nephrology.

## 2014-11-14 NOTE — Progress Notes (Signed)
Pre visit review using our clinic review tool, if applicable. No additional management support is needed unless otherwise documented below in the visit note. 

## 2014-11-14 NOTE — Patient Instructions (Signed)
Please complete your lab work prior to leaving. Follow up in 6 months.

## 2014-11-14 NOTE — Assessment & Plan Note (Signed)
Stable on lipitor. Continue same.

## 2014-11-14 NOTE — Telephone Encounter (Signed)
Opened in error

## 2014-11-14 NOTE — Assessment & Plan Note (Signed)
Stable, continue prn xanax.

## 2014-11-15 ENCOUNTER — Encounter: Payer: Self-pay | Admitting: Family

## 2014-11-15 LAB — TSH: TSH: 0.6 u[IU]/mL (ref 0.35–4.50)

## 2014-11-19 ENCOUNTER — Other Ambulatory Visit: Payer: Self-pay | Admitting: Family

## 2014-11-28 DIAGNOSIS — D638 Anemia in other chronic diseases classified elsewhere: Secondary | ICD-10-CM | POA: Diagnosis not present

## 2014-11-28 DIAGNOSIS — N184 Chronic kidney disease, stage 4 (severe): Secondary | ICD-10-CM | POA: Diagnosis not present

## 2014-11-28 DIAGNOSIS — N2581 Secondary hyperparathyroidism of renal origin: Secondary | ICD-10-CM | POA: Diagnosis not present

## 2014-11-29 ENCOUNTER — Telehealth: Payer: Self-pay | Admitting: Family

## 2014-11-29 NOTE — Telephone Encounter (Signed)
FYI- UDS negative for xanax.  Needs to provide UDS at time of next refill pick up.

## 2014-11-29 NOTE — Telephone Encounter (Signed)
Noted  

## 2014-12-15 ENCOUNTER — Ambulatory Visit: Payer: Medicare Other

## 2014-12-20 ENCOUNTER — Other Ambulatory Visit: Payer: Self-pay | Admitting: Family

## 2014-12-20 ENCOUNTER — Ambulatory Visit: Payer: Medicare Other

## 2014-12-20 ENCOUNTER — Encounter: Payer: Self-pay | Admitting: Family

## 2014-12-20 DIAGNOSIS — Z79899 Other long term (current) drug therapy: Secondary | ICD-10-CM | POA: Diagnosis not present

## 2014-12-20 NOTE — Telephone Encounter (Signed)
Pt has f/u 04/2015. Last refills 11/21/14, #60.  Rx printed and forwarded to Provider for signature. Pt will need to pick up rx and provide urine drug screen at that time.

## 2014-12-20 NOTE — Telephone Encounter (Signed)
Rx request to pharmacy/SLS  

## 2014-12-20 NOTE — Telephone Encounter (Signed)
Left message for pt to return my call. Rx on my desk until pt notified of below.

## 2014-12-21 NOTE — Telephone Encounter (Signed)
Spoke with PT informed of notes

## 2014-12-21 NOTE — Telephone Encounter (Signed)
Rx remains on CMAs desk at 5:00pm 01.27.16/SLS

## 2014-12-22 ENCOUNTER — Ambulatory Visit: Payer: Medicare Other

## 2014-12-23 NOTE — Telephone Encounter (Signed)
Rx sent to front desk.

## 2014-12-27 ENCOUNTER — Other Ambulatory Visit: Payer: Self-pay | Admitting: Family

## 2014-12-27 NOTE — Telephone Encounter (Signed)
Patient calling in regarding this. Would like this refilled

## 2014-12-27 NOTE — Telephone Encounter (Signed)
Rx from 12/20/14 was still at front desk as pt did not pick it up when she completed last UDS.  Is it ok to fax in to pharmacy?

## 2014-12-28 NOTE — Telephone Encounter (Signed)
Rx faxed to pharmacy and pt has been notified.

## 2014-12-28 NOTE — Telephone Encounter (Signed)
Yes please

## 2014-12-31 ENCOUNTER — Other Ambulatory Visit: Payer: Self-pay | Admitting: Family

## 2015-01-18 ENCOUNTER — Encounter: Payer: Self-pay | Admitting: Family

## 2015-01-27 ENCOUNTER — Telehealth: Payer: Self-pay | Admitting: Family

## 2015-01-27 MED ORDER — ALPRAZOLAM 0.5 MG PO TABS: 0.5000 mg | ORAL_TABLET | Freq: Three times a day (TID) | ORAL | Status: DC

## 2015-01-27 NOTE — Telephone Encounter (Signed)
Caller name: Lasonia, Casas Relation to pt: self  Call back number: [phone]   Reason for call:  Pt requesting a refill ALPRAZolam (XANAX) 0.5 MG tablet

## 2015-01-27 NOTE — Telephone Encounter (Signed)
Rx printed and forwarded to Provider for signature.

## 2015-01-31 NOTE — Telephone Encounter (Signed)
Rx was faxed to pharmacy on 01/27/15 @ 3:02pm.

## 2015-02-26 ENCOUNTER — Other Ambulatory Visit: Payer: Self-pay | Admitting: Family

## 2015-03-01 ENCOUNTER — Other Ambulatory Visit: Payer: Self-pay | Admitting: *Deleted

## 2015-03-01 NOTE — Telephone Encounter (Signed)
[facility] Alp[facility]am 0.5mg  1 tablet three times daily, #60. Last Rx 01/27/15. Last OV 11/14/14 and f/u 05/16/15.  Please advise.

## 2015-03-02 NOTE — Telephone Encounter (Signed)
Pt is due for UDS, recommend that we print and leave at front desk for her to pick up when she gives sample.

## 2015-03-03 ENCOUNTER — Encounter: Payer: Self-pay | Admitting: *Deleted

## 2015-03-03 DIAGNOSIS — Z79899 Other long term (current) drug therapy: Secondary | ICD-10-CM | POA: Diagnosis not present

## 2015-03-03 MED ORDER — ALPRAZOLAM 0.5 MG PO TABS: 0.5000 mg | ORAL_TABLET | Freq: Three times a day (TID) | ORAL | Status: DC

## 2015-03-03 NOTE — Telephone Encounter (Signed)
Rx signed and placed at front desk for pick up. Notified pt and printed Rx to Provider for signature.

## 2015-03-06 DIAGNOSIS — D638 Anemia in other chronic diseases classified elsewhere: Secondary | ICD-10-CM | POA: Diagnosis not present

## 2015-03-06 DIAGNOSIS — N184 Chronic kidney disease, stage 4 (severe): Secondary | ICD-10-CM | POA: Diagnosis not present

## 2015-03-06 DIAGNOSIS — N2581 Secondary hyperparathyroidism of renal origin: Secondary | ICD-10-CM | POA: Diagnosis not present

## 2015-03-08 DIAGNOSIS — N2581 Secondary hyperparathyroidism of renal origin: Secondary | ICD-10-CM | POA: Diagnosis not present

## 2015-03-08 DIAGNOSIS — D638 Anemia in other chronic diseases classified elsewhere: Secondary | ICD-10-CM | POA: Diagnosis not present

## 2015-03-08 DIAGNOSIS — N183 Chronic kidney disease, stage 3 (moderate): Secondary | ICD-10-CM | POA: Diagnosis not present

## 2015-03-08 DIAGNOSIS — I959 Hypotension, unspecified: Secondary | ICD-10-CM | POA: Diagnosis not present

## 2015-03-28 ENCOUNTER — Other Ambulatory Visit: Payer: Self-pay | Admitting: Family

## 2015-03-28 NOTE — Telephone Encounter (Signed)
UDS on file, drug screen due 06/02/15.  Please advise.  Medication name:  Name from pharmacy:  ALPRAZolam (XANAX) 0.5 MG tablet ALPRAZOLAM 0.5 MG TABLET     Sig: TAKE 1 TABLET BY MOUTH 3 TIMES A DAY    Dispense: 60 tablet   Refills: 0   Start: 03/28/2015   Class: Normal    Notes to pharmacy: Not to exceed 5 additional fills before 08/30/2015.    Requested on: 03/03/2015    Originally ordered on: 03/05/2011 03/03/2015

## 2015-03-29 ENCOUNTER — Encounter: Payer: Self-pay | Admitting: Family

## 2015-03-29 NOTE — Telephone Encounter (Signed)
Ok to send 60 tabs zero refill

## 2015-03-29 NOTE — Telephone Encounter (Signed)
Rx called to pharmacy voicemail. 

## 2015-03-31 ENCOUNTER — Other Ambulatory Visit: Payer: Self-pay | Admitting: Family

## 2015-04-23 ENCOUNTER — Other Ambulatory Visit: Payer: Self-pay | Admitting: Family

## 2015-04-25 NOTE — Telephone Encounter (Signed)
Rx faxed to pharmacy  

## 2015-04-25 NOTE — Telephone Encounter (Signed)
UDS--moderate 02/2015, [facility] on file. Pt has f/u 05/16/15. Rx printed and forwarded to Provider for signature.      Medication name:  Name from pharmacy:  ALPRAZolam (XANAX) 0.5 MG tablet ALPRAZOLAM 0.5 MG TABLET    Sig: TAKE 1 TABLET BY MOUTH 3 TIMES A DAY    Dispense: 60 tablet   Refills: 0   Start: 04/23/2015   Class: Normal    Notes to pharmacy: Not to exceed 5 additional fills before 09/25/2015    Requested on: 03/29/2015    Originally ordered on: 03/05/2011 03/29/2015

## 2015-05-16 ENCOUNTER — Encounter: Payer: Self-pay | Admitting: Family

## 2015-05-16 ENCOUNTER — Telehealth: Payer: Self-pay | Admitting: Family

## 2015-05-16 ENCOUNTER — Ambulatory Visit (INDEPENDENT_AMBULATORY_CARE_PROVIDER_SITE_OTHER): Payer: Medicare Other | Admitting: Family

## 2015-05-16 VITALS — BP 120/60 | HR 70 | Temp 97.8°F | Resp 16 | Ht 60.0 in | Wt 102.0 lb

## 2015-05-16 DIAGNOSIS — E785 Hyperlipidemia, unspecified: Secondary | ICD-10-CM

## 2015-05-16 DIAGNOSIS — K219 Gastro-esophageal reflux disease without esophagitis: Secondary | ICD-10-CM | POA: Diagnosis not present

## 2015-05-16 DIAGNOSIS — E039 Hypothyroidism, unspecified: Secondary | ICD-10-CM | POA: Diagnosis not present

## 2015-05-16 DIAGNOSIS — J45909 Unspecified asthma, uncomplicated: Secondary | ICD-10-CM

## 2015-05-16 LAB — LIPID PANEL
CHOLESTEROL: 148 mg/dL (ref 0–200)
HDL: 52.3 mg/dL (ref >39.00)
LDL Cholesterol: 70 mg/dL (ref 0–99)
NONHDL: 95.7
Total CHOL/HDL Ratio: 3
Triglycerides: 128 mg/dL (ref 0.0–149.0)
VLDL: 25.6 mg/dL (ref 0.0–40.0)

## 2015-05-16 LAB — TSH: TSH: 0.62 u[IU]/mL (ref 0.35–4.50)

## 2015-05-16 MED ORDER — ALBUTEROL SULFATE HFA 108 (90 BASE) MCG/ACT IN AERS: 2.0000 | INHALATION_SPRAY | Freq: Four times a day (QID) | RESPIRATORY_TRACT | Status: DC | PRN

## 2015-05-16 MED ORDER — ESOMEPRAZOLE MAGNESIUM 40 MG PO CPDR: DELAYED_RELEASE_CAPSULE | ORAL | Status: DC

## 2015-05-16 MED ORDER — ATORVASTATIN CALCIUM 10 MG PO TABS: 10.0000 mg | ORAL_TABLET | Freq: Every day | ORAL | Status: DC

## 2015-05-16 NOTE — Assessment & Plan Note (Signed)
Stable, continue prn albuterol.

## 2015-05-16 NOTE — Telephone Encounter (Signed)
Opened in error

## 2015-05-16 NOTE — Patient Instructions (Addendum)
Please complete lab work prior to leaving. Please schedule a medicare wellness visit at the front desk.

## 2015-05-16 NOTE — Assessment & Plan Note (Signed)
Stable on PPI continue same.

## 2015-05-16 NOTE — Progress Notes (Signed)
Pre visit review using our clinic review tool, if applicable. No additional management support is needed unless otherwise documented below in the visit note. 

## 2015-05-16 NOTE — Progress Notes (Signed)
Subjective:    Patient ID: Shari Schmitt, female    DOB: 09-Jun-1941, 74 y.o.   MRN: [id number]  HPI  Shari Schmitt is a 74 yr old female who presents today for follow up.   Anxiety-  Took dose last night for insomnia.  Uses PRN usually 2-3 times a day.   GERD- stable on nexium.  Reports she has tried to come off in the past and had recurrence of her gerd symptoms.   Asthma- Denies wheezing, reports that she only needs albuterol in the spring and fall.   Review of Systems See HPI  Past Medical History  Diagnosis Date  . Renal insufficiency   . Biliary cirrhosis   . Hypotension   . Anxiety   . Asthma   . Kidney disease   . Diverticulitis   . Pancreas disorder   . Hyperthyroidism   . Dermatitis herpetiformis   . Celiac disease     History   Social History  . Marital Status: Legally Separated    Spouse Name: N/A  . Number of Children: 4  . Years of Education: N/A   Occupational History  . retired    Social History Main Topics  . Smoking status: Former Smoker    Types: Cigarettes    Quit date: 11/25/1981  . Smokeless tobacco: Never Used  . Alcohol Use: No  . Drug Use: No  . Sexual Activity: Not on file   Other Topics Concern  . 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             Past Surgical History  Procedure Laterality Date  . Pancreas surgery  1962    80% removed  . Thyroidectomy  1965    Family History  Problem Relation Age of Onset  . Emphysema Father     deceased  . Heart disease Mother     deceased  . Kidney disease Brother   . Colon cancer Neg Hx     Allergies  Allergen Reactions  . Codeine     Asthma exacerbation  . Gluten Meal Rash    Current Outpatient Prescriptions on File Prior to Visit  Medication Sig Dispense Refill  . albuterol  (PROAIR HFA) 108 (90 BASE) MCG/ACT inhaler Inhale 2 puffs into the lungs every 6 (six) hours as needed. 1 Inhaler 2  . ALPRAZolam (XANAX) 0.5 MG tablet TAKE 1 TABLET BY MOUTH 3 TIMES A DAY 60 tablet 0  . atorvastatin (LIPITOR) 10 MG tablet TAKE 1 TABLET DAILY 90 tablet 0  . cetirizine (ZYRTEC) 10 MG tablet Take 1 tablet (10 mg total) by mouth daily. 30 tablet 0  . esomeprazole (NEXIUM) 40 MG capsule TAKE 1 CAPSULE DAILY BEFORE BREAKFAST 90 capsule 0  . fexofenadine-pseudoephedrine (ALLEGRA-D) 60-120 MG per tablet One tablet twice daily as needed 60 tablet 6  . fluocinonide gel (LIDEX) 0.05 % APPLY TO AFFECTED AREA AS NEEDED 60 g 0  . levothyroxine (SYNTHROID, LEVOTHROID) 75 MCG tablet TAKE 1 TABLET BY MOUTH EVERY DAY 30 tablet 5  . Multiple Vitamin (MULTIVITAMIN) tablet Take 1 tablet by mouth daily.      . ursodiol (ACTIGALL) 300 MG capsule TAKE 1 CAPSULE THREE TIMES A DAY 270 capsule 1   No  current facility-administered medications on file prior to visit.    BP 120/60 mmHg  Pulse 70  Temp(Src) 97.8 F (36.6 C) (Oral)  Resp 16  Ht 5' (1.524 m)  Wt 102 lb (46.267 kg)  BMI 19.92 kg/m2  SpO2 99%       Objective:   Physical Exam  Constitutional: She is oriented to person, place, and time. She appears well-developed and well-nourished.  HENT:  Head: Normocephalic and atraumatic.  Cardiovascular: Normal rate, regular rhythm and normal heart sounds.   No murmur heard. Pulmonary/Chest: Effort normal and breath sounds normal. No respiratory distress. She has no wheezes.  Neurological: She is alert and oriented to person, place, and time.  Skin: Skin is warm and dry.  Psychiatric: She has a normal mood and affect. Her behavior is normal. Judgment and thought content normal.          Assessment & Plan:

## 2015-05-18 ENCOUNTER — Other Ambulatory Visit: Payer: Self-pay | Admitting: Family

## 2015-05-18 MED ORDER — LEVOTHYROXINE SODIUM 75 MCG PO TABS: 75.0000 ug | ORAL_TABLET | Freq: Every day | ORAL | Status: DC

## 2015-05-21 ENCOUNTER — Other Ambulatory Visit: Payer: Self-pay | Admitting: Family

## 2015-05-22 NOTE — Telephone Encounter (Signed)
Faxed hardcopy for [facility]am to [facility] [address] [place]

## 2015-05-22 NOTE — Telephone Encounter (Signed)
OK to refill, need to know when her last dose was please. She needs to repeat UDS.

## 2015-05-22 NOTE — Telephone Encounter (Signed)
Requesting:  Alprazolam Contract  Signed 11/15/14 UDS  Given on 03/03/15 Last OV    05/16/15 Last Refill    04/25/15  #30 with 0 refills.  Please Advise

## 2015-05-23 ENCOUNTER — Other Ambulatory Visit: Payer: Self-pay | Admitting: Family

## 2015-06-12 ENCOUNTER — Other Ambulatory Visit: Payer: Self-pay | Admitting: Family

## 2015-06-16 ENCOUNTER — Other Ambulatory Visit: Payer: Self-pay | Admitting: Family

## 2015-06-16 ENCOUNTER — Other Ambulatory Visit: Payer: Medicare Other

## 2015-06-16 DIAGNOSIS — Z79899 Other long term (current) drug therapy: Secondary | ICD-10-CM | POA: Diagnosis not present

## 2015-06-16 NOTE — Telephone Encounter (Signed)
Pt called back and was notified Re: Rxs. Pt states [facility] and [facility] told her to contact us for [facility]roxine.  Spoke with pharmacist and verified that they received Rx on 05/20/15. Notified pt.

## 2015-06-16 NOTE — Telephone Encounter (Signed)
Rx signed and placed at front desk with note that pt should complete UDS when she picks up Rx.  Attempted to notify pt and left message for her to return my call.

## 2015-06-16 NOTE — Telephone Encounter (Signed)
Last RX given 05/21/15.  Pt will need to pick up RX and provide UDS at that time as she was due for repeat in July.  Rx printed and forwarded to Provider for signature.   Medication name:  Name from pharmacy:  ALPRAZolam (XANAX) 0.5 MG tablet ALPRAZOLAM 0.5 MG TABLET     Sig: TAKE 1 TABLET BY MOUTH 3 TIMES A DAY    Dispense: 60 tablet   Refills: 0   Start: 06/16/2015   Class: Normal    Notes to pharmacy: Not to exceed 5 additional fills before 10/22/2015    Requested on: 04/25/2015    Originally ordered on: 03/05/2011 04/25/2015

## 2015-06-29 ENCOUNTER — Telehealth: Payer: Self-pay | Admitting: Family

## 2015-06-29 NOTE — Telephone Encounter (Signed)
Patient was wanting to transfer from [facility] to Dr. Ronnald Ramp due to location Please advise

## 2015-06-30 ENCOUNTER — Telehealth: Payer: Self-pay | Admitting: Family

## 2015-06-30 NOTE — Telephone Encounter (Signed)
Left message for pt to return my call.

## 2015-06-30 NOTE — Telephone Encounter (Signed)
Please let pt know that I reviewed her last 3 UDS. All negative for xanax despite regular refills.  Per controlled substance contract, I will no longer be able to refill xanax due to inconsistent drug screen.

## 2015-06-30 NOTE — Telephone Encounter (Signed)
Ok with me 

## 2015-07-01 NOTE — Telephone Encounter (Signed)
Ok with me 

## 2015-07-03 NOTE — Telephone Encounter (Signed)
lmovm

## 2015-07-03 NOTE — Telephone Encounter (Signed)
Notified pt. Pt states she takes medication every day and doesn't understand why it was not showing up in result. Advised pt that it should be positive if she is taking it every day and based on result and contract we will not be able to continue prescribing controlled substances to pt.

## 2015-07-12 ENCOUNTER — Other Ambulatory Visit (INDEPENDENT_AMBULATORY_CARE_PROVIDER_SITE_OTHER): Payer: Medicare Other

## 2015-07-12 ENCOUNTER — Encounter: Payer: Self-pay | Admitting: Internal Medicine

## 2015-07-12 ENCOUNTER — Ambulatory Visit (INDEPENDENT_AMBULATORY_CARE_PROVIDER_SITE_OTHER): Payer: Medicare Other | Admitting: Internal Medicine

## 2015-07-12 VITALS — BP 120/72 | HR 87 | Temp 97.9°F | Resp 16 | Ht 60.0 in | Wt 97.0 lb

## 2015-07-12 DIAGNOSIS — N182 Chronic kidney disease, stage 2 (mild): Secondary | ICD-10-CM | POA: Diagnosis not present

## 2015-07-12 DIAGNOSIS — K900 Celiac disease: Secondary | ICD-10-CM | POA: Diagnosis not present

## 2015-07-12 DIAGNOSIS — Z23 Encounter for immunization: Secondary | ICD-10-CM | POA: Diagnosis not present

## 2015-07-12 DIAGNOSIS — F411 Generalized anxiety disorder: Secondary | ICD-10-CM

## 2015-07-12 DIAGNOSIS — M810 Age-related osteoporosis without current pathological fracture: Secondary | ICD-10-CM

## 2015-07-12 DIAGNOSIS — E559 Vitamin D deficiency, unspecified: Secondary | ICD-10-CM

## 2015-07-12 DIAGNOSIS — E890 Postprocedural hypothyroidism: Secondary | ICD-10-CM

## 2015-07-12 DIAGNOSIS — Z1231 Encounter for screening mammogram for malignant neoplasm of breast: Secondary | ICD-10-CM

## 2015-07-12 DIAGNOSIS — M8580 Other specified disorders of bone density and structure, unspecified site: Secondary | ICD-10-CM

## 2015-07-12 LAB — COMPREHENSIVE METABOLIC PANEL
ALBUMIN: 3.8 g/dL (ref 3.5–5.2)
ALK PHOS: 87 U/L (ref 39–117)
ALT: 10 U/L (ref 0–35)
AST: 19 U/L (ref 0–37)
BILIRUBIN TOTAL: 0.3 mg/dL (ref 0.2–1.2)
BUN: 10 mg/dL (ref 6–23)
CO2: 18 mEq/L — ABNORMAL LOW (ref 19–32)
CREATININE: 2.53 mg/dL — AB (ref 0.40–1.20)
Calcium: 8.8 mg/dL (ref 8.4–10.5)
Chloride: 110 mEq/L (ref 96–112)
GFR: 19.64 mL/min — ABNORMAL LOW (ref >60.00)
GLUCOSE: 90 mg/dL (ref 70–99)
POTASSIUM: 3.6 meq/L (ref 3.5–5.1)
SODIUM: 134 meq/L — AB (ref 135–145)
TOTAL PROTEIN: 7.1 g/dL (ref 6.0–8.3)

## 2015-07-12 LAB — CBC WITH DIFFERENTIAL/PLATELET
BASOS ABS: 0.1 10*3/uL (ref 0.0–0.1)
Basophils Relative: 1.1 % (ref 0.0–3.0)
EOS PCT: 2.3 % (ref 0.0–5.0)
Eosinophils Absolute: 0.1 10*3/uL (ref 0.0–0.7)
HCT: 37.4 % (ref 36.0–46.0)
HEMOGLOBIN: 12.4 g/dL (ref 12.0–15.0)
Lymphocytes Relative: 19.6 % (ref 12.0–46.0)
Lymphs Abs: 1.1 10*3/uL (ref 0.7–4.0)
MCHC: 33.2 g/dL (ref 30.0–36.0)
MCV: 87.6 fl (ref 78.0–100.0)
MONO ABS: 0.4 10*3/uL (ref 0.1–1.0)
MONOS PCT: 7.7 % (ref 3.0–12.0)
NEUTROS PCT: 69.3 % (ref 43.0–77.0)
Neutro Abs: 3.7 10*3/uL (ref 1.4–7.7)
Platelets: 219 10*3/uL (ref 150.0–400.0)
RBC: 4.27 Mil/uL (ref 3.87–5.11)
RDW: 14.2 % (ref 11.5–15.5)
WBC: 5.4 10*3/uL (ref 4.0–10.5)

## 2015-07-12 LAB — IBC PANEL
IRON: 92 ug/dL (ref 42–145)
SATURATION RATIOS: 29.1 % (ref 20.0–50.0)
TRANSFERRIN: 226 mg/dL (ref 212.0–360.0)

## 2015-07-12 LAB — VITAMIN D 25 HYDROXY (VIT D DEFICIENCY, FRACTURES): VITD: 27.28 ng/mL — AB (ref 30.00–100.00)

## 2015-07-12 LAB — FERRITIN: Ferritin: 123.1 ng/mL (ref 10.0–291.0)

## 2015-07-12 LAB — FOLATE: Folate: 24.2 ng/mL (ref >5.9)

## 2015-07-12 LAB — VITAMIN B12: VITAMIN B 12: 630 pg/mL (ref 211–911)

## 2015-07-12 LAB — TSH: TSH: 0.15 u[IU]/mL — AB (ref 0.35–4.50)

## 2015-07-12 MED ORDER — ALPRAZOLAM 0.5 MG PO TABS: 0.5000 mg | ORAL_TABLET | Freq: Three times a day (TID) | ORAL | Status: DC

## 2015-07-12 NOTE — Patient Instructions (Signed)
Hypothyroidism The thyroid is a large gland located in the lower front of your neck. The thyroid gland helps control metabolism. Metabolism is how your body handles food. It controls metabolism with the hormone thyroxine. When this gland is underactive (hypothyroid), it produces too little hormone.  CAUSES These include:   Absence or destruction of thyroid tissue.  Goiter due to iodine deficiency.  Goiter due to medications.  Congenital defects (since birth).  Problems with the pituitary. This causes a lack of TSH (thyroid stimulating hormone). This hormone tells the thyroid to turn out more hormone. SYMPTOMS  Lethargy (feeling as though you have no energy)  Cold intolerance  Weight gain (in spite of normal food intake)  Dry skin  Coarse hair  Menstrual irregularity (if severe, may lead to infertility)  Slowing of thought processes Cardiac problems are also caused by insufficient amounts of thyroid hormone. Hypothyroidism in the newborn is cretinism, and is an extreme form. It is important that this form be treated adequately and immediately or it will lead rapidly to retarded physical and mental development. DIAGNOSIS  To prove hypothyroidism, your caregiver may do blood tests and ultrasound tests. Sometimes the signs are hidden. It may be necessary for your caregiver to watch this illness with blood tests either before or after diagnosis and treatment. TREATMENT  Low levels of thyroid hormone are increased by using synthetic thyroid hormone. This is a safe, effective treatment. It usually takes about four weeks to gain the full effects of the medication. After you have the full effect of the medication, it will generally take another four weeks for problems to leave. Your caregiver may start you on low doses. If you have had heart problems the dose may be gradually increased. It is generally not an emergency to get rapidly to normal. HOME CARE INSTRUCTIONS   Take your  medications as your caregiver suggests. Let your caregiver know of any medications you are taking or start taking. Your caregiver will help you with dosage schedules.  As your condition improves, your dosage needs may increase. It will be necessary to have continuing blood tests as suggested by your caregiver.  Report all suspected medication side effects to your caregiver. SEEK MEDICAL CARE IF: Seek medical care if you develop:  Sweating.  Tremulousness (tremors).  Anxiety.  Rapid weight loss.  Heat intolerance.  Emotional swings.  Diarrhea.  Weakness. SEEK IMMEDIATE MEDICAL CARE IF:  You develop chest pain, an irregular heart beat (palpitations), or a rapid heart beat. MAKE SURE YOU:   Understand these instructions.  Will watch your condition.  Will get help right away if you are not doing well or get worse. Document Released: 11/11/2005 Document Revised: 02/03/2012 Document Reviewed: 07/01/2008 Exit[employer] Patient Information 2015 [employer]. This information is not intended to replace advice given to you by your health care provider. Make sure you discuss any questions you have with your health care provider.  

## 2015-07-12 NOTE — Progress Notes (Signed)
Subjective:  Patient ID: Shari Schmitt, female    DOB: 1941/01/10  Age: 74 y.o. MRN: [id number]  CC: Hypothyroidism   HPI Shari Schmitt presents for follow-up on thyroid disease and chronic renal insufficiency. She also needs a refill of Xanax for her generalized anxiety disorder. She has a history of celiac disease and pancreatic surgery but she denies any abdominal pain, nausea, vomiting, diarrhea, or weight loss.  History [name] has a past medical history of Renal insufficiency; Biliary cirrhosis; Hypotension; Anxiety; Asthma; Kidney disease; Diverticulitis; Pancreas disorder; Hyperthyroidism; Dermatitis herpetiformis; and Celiac disease.   She has past surgical history that includes Pancreas surgery (1962) and Thyroidectomy (1965).   Her family history includes Emphysema in her father; Heart disease in her mother; Kidney disease in her brother. There is no history of Colon cancer.She reports that she quit smoking about 33 years ago. Her smoking use included Cigarettes. She has never used smokeless tobacco. She reports that she does not drink alcohol or use illicit drugs.  Outpatient Prescriptions Prior to Visit  Medication Sig Dispense Refill  . albuterol (PROAIR HFA) 108 (90 BASE) MCG/ACT inhaler Inhale 2 puffs into the lungs every 6 (six) hours as needed. 1 Inhaler 2  . atorvastatin (LIPITOR) 10 MG tablet Take 1 tablet (10 mg total) by mouth daily. 90 tablet 1  . esomeprazole (NEXIUM) 40 MG capsule TAKE 1 CAPSULE DAILY BEFORE BREAKFAST 90 capsule 1  . fexofenadine-pseudoephedrine (ALLEGRA-D) 60-120 MG per tablet One tablet twice daily as needed 60 tablet 6  . fluocinonide gel (LIDEX) 0.05 % APPLY TO AFFECTED AREA AS NEEDED 60 g 0  . Multiple Vitamin (MULTIVITAMIN) tablet Take 1 tablet by mouth daily.      . ursodiol (ACTIGALL) 300 MG capsule TAKE 1 CAPSULE THREE TIMES A DAY 270 capsule 1  . ALPRAZolam (XANAX) 0.5 MG tablet TAKE 1 TABLET BY MOUTH 3 TIMES A DAY 60 tablet 0  .  cetirizine (ZYRTEC) 10 MG tablet Take 1 tablet (10 mg total) by mouth daily. 30 tablet 0  . levothyroxine (SYNTHROID, LEVOTHROID) 75 MCG tablet Take 1 tablet (75 mcg total) by mouth daily. 30 tablet 5  . levothyroxine (SYNTHROID, LEVOTHROID) 75 MCG tablet TAKE 1 TABLET BY MOUTH EVERY DAY 30 tablet 5   No facility-administered medications prior to visit.    ROS Review of Systems  Constitutional: Negative.  Negative for fever, chills, diaphoresis and fatigue.  HENT: Negative.   Eyes: Negative.   Respiratory: Negative.  Negative for cough, choking, chest tightness, shortness of breath and stridor.   Cardiovascular: Negative.  Negative for chest pain, palpitations and leg swelling.  Gastrointestinal: Negative.  Negative for nausea, vomiting, abdominal pain, diarrhea, constipation and blood in stool.  Endocrine: Negative.   Genitourinary: Negative.   Musculoskeletal: Negative.   Skin: Negative.  Negative for pallor.  Allergic/Immunologic: Negative.   Neurological: Negative.   Hematological: Negative.  Negative for adenopathy. Does not bruise/bleed easily.  Psychiatric/Behavioral: Negative for suicidal ideas, confusion, self-injury, dysphoric mood and decreased concentration. The patient is nervous/anxious.     Objective:  BP 120/72 mmHg  Pulse 87  Temp(Src) 97.9 F (36.6 C) (Oral)  Resp 16  Ht 5' (1.524 m)  Wt 97 lb (43.999 kg)  BMI 18.94 kg/m2  SpO2 99%  Physical Exam  Constitutional: She is oriented to person, place, and time. No distress.  HENT:  Mouth/Throat: Oropharynx is clear and moist. No oropharyngeal exudate.  Eyes: Conjunctivae are normal. Right eye exhibits no discharge. Left eye  exhibits no discharge. No scleral icterus.  Neck: Normal range of motion. Neck supple. No JVD present. No tracheal deviation present. No thyroid mass and no thyromegaly present.  Cardiovascular: Normal rate, regular rhythm, normal heart sounds and intact distal pulses.  Exam reveals no gallop  and no friction rub.   No murmur heard. Pulmonary/Chest: Effort normal and breath sounds normal. No stridor. No respiratory distress. She has no wheezes. She has no rales. She exhibits no tenderness.  Abdominal: Soft. Bowel sounds are normal. She exhibits no distension and no mass. There is no tenderness. There is no rebound and no guarding.  Musculoskeletal: Normal range of motion. She exhibits no edema or tenderness.  Lymphadenopathy:    She has no cervical adenopathy.  Neurological: She is oriented to person, place, and time.  Skin: Skin is warm and dry. No rash noted. She is not diaphoretic. No erythema. No pallor.  Psychiatric: She has a normal mood and affect. Her behavior is normal. Judgment and thought content normal.   Lab Results  Component Value Date   WBC 5.4 07/12/2015   HGB 12.4 07/12/2015   HCT 37.4 07/12/2015   PLT 219.0 07/12/2015   GLUCOSE 90 07/12/2015   CHOL 148 05/16/2015   TRIG 128.0 05/16/2015   HDL 52.30 05/16/2015   LDLCALC 70 05/16/2015   ALT 10 07/12/2015   AST 19 07/12/2015   NA 134* 07/12/2015   K 3.6 07/12/2015   CL 110 07/12/2015   CREATININE 2.53* 07/12/2015   BUN 10 07/12/2015   CO2 18* 07/12/2015   TSH 0.15* 07/12/2015   INR 1.0 03/06/2011     Assessment & Plan:   Mallak was seen today for hypothyroidism.  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  Osteoporosis- she is due for follow-up DEXA scan, she also needs to start taking a vitamin D supplement -     Vit D  25 hydroxy (rtn osteoporosis  monitoring); Future -     DG Bone Density; Future -     Cholecalciferol 2000 UNITS TABS; Take 1 tablet (2,000 Units total) by mouth daily.  Chronic renal disease, stage 2 (mild)- her renal function has declined slightly, I have asked her to follow-up with nephrology -     Ambulatory referral to Nephrology  GAD (generalized anxiety disorder) -     ALPRAZolam (XANAX) 0.5 MG tablet; Take 1 tablet (0.5 mg total) by mouth 3 (three) times daily.  Celiac disease- she is on a gluten-free diet and does not experience any diarrhea. Her only vitamin deficiency is vitamin D. -     Vit D  25 hydroxy (rtn osteoporosis monitoring); Future -     IBC panel; Future -     Ferritin; Future -     Folate; Future -     Vitamin B12; Future  Need for zoster vaccination -     Varicella-zoster vaccine subcutaneous  Need for 23-polyvalent pneumococcal polysaccharide vaccine -     Pneumococcal polysaccharide  vaccine 23-valent greater than or equal to 2yo subcutaneous/IM  Vitamin D deficiency- I will start a vitamin D supplement to treat this -     Cholecalciferol 2000 UNITS TABS; Take 1 tablet (2,000 Units total) by mouth daily.   I have discontinued Ms. Delone's cetirizine, levothyroxine, and levothyroxine. I have also changed her ALPRAZolam. Additionally, I am having her start on levothyroxine and Cholecalciferol. Lastly, I am having her maintain her multivitamin, fexofenadine-pseudoephedrine, fluocinonide gel, esomeprazole, atorvastatin, albuterol, and ursodiol.  Meds ordered this encounter  Medications  . ALPRAZolam (XANAX) 0.5 MG tablet    Sig: Take 1 tablet (0.5 mg total) by mouth 3 (three) times daily.    Dispense:  60 tablet    Refill:  5    Not to exceed 5 additional fills before 10/22/2015  . levothyroxine (SYNTHROID) 50 MCG tablet    Sig: Take 1 tablet (50 mcg total) by mouth daily before breakfast.    Dispense:  30 tablet    Refill:  3  . Cholecalciferol 2000 UNITS TABS    Sig: Take 1 tablet  (2,000 Units total) by mouth daily.    Dispense:  90 tablet    Refill:  3     Follow-up: Return in about 4 months (around 11/11/2015).  Scarlette Calico, MD

## 2015-07-12 NOTE — Progress Notes (Signed)
Pre visit review using our clinic review tool, if applicable. No additional management support is needed unless otherwise documented below in the visit note. 

## 2015-07-13 ENCOUNTER — Encounter: Payer: Self-pay | Admitting: Internal Medicine

## 2015-07-13 DIAGNOSIS — E559 Vitamin D deficiency, unspecified: Secondary | ICD-10-CM | POA: Insufficient documentation

## 2015-07-13 MED ORDER — CHOLECALCIFEROL 50 MCG (2000 UT) PO TABS: 1.0000 | ORAL_TABLET | Freq: Every day | ORAL | Status: DC

## 2015-07-13 MED ORDER — LEVOTHYROXINE SODIUM 50 MCG PO TABS: 50.0000 ug | ORAL_TABLET | Freq: Every day | ORAL | Status: DC

## 2015-07-26 ENCOUNTER — Telehealth: Payer: Self-pay | Admitting: Family

## 2015-07-26 ENCOUNTER — Telehealth: Payer: Self-pay

## 2015-07-26 ENCOUNTER — Other Ambulatory Visit: Payer: Self-pay

## 2015-07-26 NOTE — Telephone Encounter (Signed)
Yes, it is okay to stop NSAIDs. Please give them her labs with past creatinine levels.

## 2015-07-26 NOTE — Telephone Encounter (Signed)
Per [facility] wanted to inform pt to stop taking Nsaids? Called to inform pt. See previous phone note for clarity

## 2015-07-26 NOTE — Telephone Encounter (Signed)
Spoke with Shari Schmitt will be faxing labs and informing pt

## 2015-07-26 NOTE — Telephone Encounter (Signed)
Dr. Ronnald Ramp referred patient to [facility] from there called to let us know that the doctor there advises patient to stop taking nsaids and they need verification from Dr. Ronnald Ramp They are also needing older creatinine levels Please call Stacy at [phone] ext [phone]

## 2015-07-26 NOTE — Telephone Encounter (Signed)
Ok to stop nsaids?

## 2015-08-07 ENCOUNTER — Emergency Department (HOSPITAL_COMMUNITY)
Admission: EM | Admit: 2015-08-07 | Discharge: 2015-08-07 | Disposition: A | Discharge status: Home / Self Care | Payer: Medicare Other | Attending: Emergency Medicine | Admitting: Emergency Medicine

## 2015-08-07 ENCOUNTER — Encounter (HOSPITAL_COMMUNITY): Payer: Self-pay | Admitting: Emergency Medicine

## 2015-08-07 ENCOUNTER — Emergency Department (HOSPITAL_COMMUNITY): Payer: Medicare Other

## 2015-08-07 DIAGNOSIS — E0590 Thyrotoxicosis, unspecified without thyrotoxic crisis or storm: Secondary | ICD-10-CM | POA: Diagnosis not present

## 2015-08-07 DIAGNOSIS — Z87448 Personal history of other diseases of urinary system: Secondary | ICD-10-CM | POA: Diagnosis not present

## 2015-08-07 DIAGNOSIS — K745 Biliary cirrhosis, unspecified: Secondary | ICD-10-CM | POA: Diagnosis not present

## 2015-08-07 DIAGNOSIS — Z79899 Other long term (current) drug therapy: Secondary | ICD-10-CM | POA: Insufficient documentation

## 2015-08-07 DIAGNOSIS — Z872 Personal history of diseases of the skin and subcutaneous tissue: Secondary | ICD-10-CM | POA: Insufficient documentation

## 2015-08-07 DIAGNOSIS — Z87891 Personal history of nicotine dependence: Secondary | ICD-10-CM | POA: Insufficient documentation

## 2015-08-07 DIAGNOSIS — F419 Anxiety disorder, unspecified: Secondary | ICD-10-CM | POA: Insufficient documentation

## 2015-08-07 DIAGNOSIS — J029 Acute pharyngitis, unspecified: Secondary | ICD-10-CM | POA: Diagnosis present

## 2015-08-07 DIAGNOSIS — R000 Tachycardia, unspecified: Secondary | ICD-10-CM | POA: Diagnosis not present

## 2015-08-07 DIAGNOSIS — J45909 Unspecified asthma, uncomplicated: Secondary | ICD-10-CM | POA: Insufficient documentation

## 2015-08-07 DIAGNOSIS — R002 Palpitations: Secondary | ICD-10-CM | POA: Diagnosis not present

## 2015-08-07 LAB — CBC
HEMATOCRIT: 39.5 % (ref 36.0–46.0)
HEMOGLOBIN: 13 g/dL (ref 12.0–15.0)
MCH: 29 pg (ref 26.0–34.0)
MCHC: 32.9 g/dL (ref 30.0–36.0)
MCV: 88 fL (ref 78.0–100.0)
Platelets: 199 10*3/uL (ref 150–400)
RBC: 4.49 MIL/uL (ref 3.87–5.11)
RDW: 14.7 % (ref 11.5–15.5)
WBC: 5.2 10*3/uL (ref 4.0–10.5)

## 2015-08-07 LAB — BASIC METABOLIC PANEL
ANION GAP: 6 (ref 5–15)
BUN: 8 mg/dL (ref 6–20)
CALCIUM: 8.9 mg/dL (ref 8.9–10.3)
CO2: 19 mmol/L — AB (ref 22–32)
Chloride: 110 mmol/L (ref 101–111)
Creatinine, Ser: 2.7 mg/dL — ABNORMAL HIGH (ref 0.44–1.00)
GFR, EST AFRICAN AMERICAN: 19 mL/min — AB (ref >60)
GFR, EST NON AFRICAN AMERICAN: 16 mL/min — AB (ref >60)
GLUCOSE: 96 mg/dL (ref 65–99)
POTASSIUM: 3.6 mmol/L (ref 3.5–5.1)
Sodium: 135 mmol/L (ref 135–145)

## 2015-08-07 LAB — T4, FREE: FREE T4: 0.87 ng/dL (ref 0.61–1.12)

## 2015-08-07 LAB — I-STAT TROPONIN, ED: Troponin i, poc: 0 ng/mL (ref 0.00–0.08)

## 2015-08-07 LAB — TSH: TSH: 0.924 u[IU]/mL (ref 0.350–4.500)

## 2015-08-07 MED ORDER — AMOXICILLIN 500 MG PO CAPS: 500.0000 mg | ORAL_CAPSULE | Freq: Three times a day (TID) | ORAL | Status: DC

## 2015-08-07 MED ORDER — SODIUM CHLORIDE 0.9 % IV BOLUS (SEPSIS)
500.0000 mL | Freq: Once | INTRAVENOUS | Status: AC
  Administered 2015-08-07: 500 mL via INTRAVENOUS

## 2015-08-07 NOTE — ED Provider Notes (Signed)
CSN: [id number]     Arrival date & time 08/07/15  [id number] History   First MD Initiated Contact with Patient 08/07/15 [phone]     Chief Complaint  Patient presents with  . Tachycardia  . Sore Throat     (Consider location/radiation/quality/duration/timing/severity/associated sxs/prior Treatment) HPI Comments: Patient presents to the ER for evaluation of sore throat. Patient first noticed symptoms 3 days ago. She reports that her throat feels dry and it hurts when she swallows. She has not noticed any swelling. There is no difficulty breathing. She has not had any fever. She has no other upper respiratory symptoms, denies cough. She has noticed her heart beating fast. There is no associated chest pain. Patient reports that she had her thyroid removed in 1965 and thinks that her symptoms are related to that surgery.  Patient is a 74 y.o. female presenting with pharyngitis.  Sore Throat    Past Medical History  Diagnosis Date  . Renal insufficiency   . Biliary cirrhosis   . Hypotension   . Anxiety   . Asthma   . Kidney disease   . Diverticulitis   . Pancreas disorder   . Hyperthyroidism   . Dermatitis herpetiformis   . Celiac disease    Past Surgical History  Procedure Laterality Date  . Pancreas surgery  1962    80% removed  . Thyroidectomy  1965   Family History  Problem Relation Age of Onset  . Emphysema Father     deceased  . Heart disease Mother     deceased  . Kidney disease Brother   . Colon cancer Neg Hx    Social History  Substance Use Topics  . Smoking status: Former Smoker    Types: Cigarettes    Quit date: 11/25/1981  . Smokeless tobacco: Never Used  . Alcohol Use: No   OB History    No data available     Review of Systems  HENT: Positive for sore throat.   Cardiovascular: Positive for palpitations.  All other systems reviewed and are negative.     Allergies  Codeine and Gluten meal  Home Medications   Prior to Admission medications    Medication Sig Start Date End Date Taking? Authorizing Provider  albuterol (PROAIR HFA) 108 (90 BASE) MCG/ACT inhaler Inhale 2 puffs into the lungs every 6 (six) hours as needed. 05/16/15   Debbrah Alar, NP  ALPRAZolam Duanne Moron) 0.5 MG tablet Take 1 tablet (0.5 mg total) by mouth 3 (three) times daily. 07/12/15   Janith Lima, MD  atorvastatin (LIPITOR) 10 MG tablet Take 1 tablet (10 mg total) by mouth daily. 05/16/15   Debbrah Alar, NP  Cholecalciferol 2000 UNITS TABS Take 1 tablet (2,000 Units total) by mouth daily. 07/13/15   Janith Lima, MD  esomeprazole (NEXIUM) 40 MG capsule TAKE 1 CAPSULE DAILY BEFORE BREAKFAST 05/16/15   Debbrah Alar, NP  fexofenadine-pseudoephedrine (ALLEGRA-D) 60-120 MG per tablet One tablet twice daily as needed 01/10/14   Debbrah Alar, NP  fluocinonide gel (LIDEX) 0.05 % APPLY TO AFFECTED AREA AS NEEDED 02/27/15   Debbrah Alar, NP  levothyroxine (SYNTHROID) 50 MCG tablet Take 1 tablet (50 mcg total) by mouth daily before breakfast. 07/13/15   Janith Lima, MD  levothyroxine (SYNTHROID, LEVOTHROID) 75 MCG tablet  06/16/15   Historical Provider, MD  Multiple Vitamin (MULTIVITAMIN) tablet Take 1 tablet by mouth daily.      Historical Provider, MD  ursodiol (ACTIGALL) 300 MG capsule TAKE 1 CAPSULE THREE TIMES  A DAY 06/13/15   Debbrah Alar, NP   BP 147/79 mmHg  Pulse 109  Temp(Src) 97.6 F (36.4 C)  Resp 18  Ht 5\' 1"  (1.549 m)  Wt 96 lb 6 oz (43.715 kg)  BMI 18.22 kg/m2  SpO2 100% Physical Exam  Constitutional: She is oriented to person, place, and time. She appears well-developed and well-nourished. No distress.  HENT:  Head: Normocephalic and atraumatic.  Right Ear: Hearing normal.  Left Ear: Hearing normal.  Nose: Nose normal.  Mouth/Throat: Oropharynx is clear and moist and mucous membranes are normal.  Eyes: Conjunctivae and EOM are normal. Pupils are equal, round, and reactive to light.  Neck: Normal range of motion. Neck  supple.  Cardiovascular: Regular rhythm, S1 normal and S2 normal.  Exam reveals no gallop and no friction rub.   No murmur heard. Pulmonary/Chest: Effort normal and breath sounds normal. No respiratory distress. She exhibits no tenderness.  Abdominal: Soft. Normal appearance and bowel sounds are normal. There is no hepatosplenomegaly. There is no tenderness. There is no rebound, no guarding, no tenderness at McBurney's point and negative Murphy's sign. No hernia.  Musculoskeletal: Normal range of motion.  Neurological: She is alert and oriented to person, place, and time. She has normal strength. No cranial nerve deficit or sensory deficit. Coordination normal. GCS eye subscore is 4. GCS verbal subscore is 5. GCS motor subscore is 6.  Skin: Skin is warm, dry and intact. No rash noted. No cyanosis.  Psychiatric: She has a normal mood and affect. Her speech is normal and behavior is normal. Thought content normal.  Nursing note and vitals reviewed.   ED Course  Procedures (including critical care time) Labs Review Labs Reviewed  BASIC METABOLIC PANEL  CBC    Imaging Review No results found. I have personally reviewed and evaluated these images and lab results as part of my medical decision-making.   EKG Interpretation   Date/Time:  Monday August 07 2015 09:20:06 EDT Ventricular Rate:  99 PR Interval:  130 QRS Duration: 56 QT Interval:  330 QTC Calculation: 423 R Axis:   46 Text Interpretation:  Normal sinus rhythm Biatrial enlargement Otherwise  within normal limits No significant change since last tracing Confirmed by  POLLINA  MD, CHRISTOPHER [phone]) on 08/07/2015 9:32:01 AM      MDM   Final diagnoses:  None   pharyngitis  Patient presents to the ER for evaluation of dry and sore throat for 3 days. She appears anxious. She thinks that her symptoms are secondary to thyroid surgery from 1965. Thyroid studies are unremarkable. Upper unremarkable. Examination does not  reveal any oropharyngeal swelling or other abnormalities. Vital signs are entirely normal. She was mildly tachycardic at arrival, but this resolved with IV fluids. She is not expressing any chest pain or shortness of breath. No concern for acute coronary syndrome or PE. Symptoms are only present with swallowing, not continuously, not felt to be an anginal equivalent.  Orpah Greek, MD 08/07/15 1229

## 2015-08-07 NOTE — ED Notes (Signed)
Patient states sore throat x 3 days ago.   Patient states it makes her heart beat fast.   Patient denies any other symptoms.

## 2015-08-07 NOTE — Discharge Instructions (Signed)

## 2015-08-08 LAB — T3, FREE: T3, Free: 2.2 pg/mL (ref 2.0–4.4)

## 2015-08-10 DIAGNOSIS — D638 Anemia in other chronic diseases classified elsewhere: Secondary | ICD-10-CM | POA: Diagnosis not present

## 2015-08-10 DIAGNOSIS — N183 Chronic kidney disease, stage 3 (moderate): Secondary | ICD-10-CM | POA: Diagnosis not present

## 2015-08-10 DIAGNOSIS — N179 Acute kidney failure, unspecified: Secondary | ICD-10-CM | POA: Diagnosis not present

## 2015-08-10 DIAGNOSIS — I959 Hypotension, unspecified: Secondary | ICD-10-CM | POA: Diagnosis not present

## 2015-08-14 ENCOUNTER — Other Ambulatory Visit: Payer: Self-pay | Admitting: Nephrology

## 2015-08-14 DIAGNOSIS — N179 Acute kidney failure, unspecified: Secondary | ICD-10-CM

## 2015-08-16 ENCOUNTER — Other Ambulatory Visit: Payer: Medicare Other

## 2015-08-16 ENCOUNTER — Ambulatory Visit: Payer: Medicare Other | Admitting: Family

## 2015-08-17 ENCOUNTER — Ambulatory Visit
Admission: RE | Admit: 2015-08-17 | Discharge: 2015-08-17 | Disposition: A | Discharge status: Home / Self Care | Payer: Medicare Other | Source: Ambulatory Visit | Attending: Nephrology | Admitting: Nephrology

## 2015-08-17 DIAGNOSIS — N179 Acute kidney failure, unspecified: Secondary | ICD-10-CM

## 2015-09-11 ENCOUNTER — Encounter: Payer: Self-pay | Admitting: Internal Medicine

## 2015-09-11 ENCOUNTER — Ambulatory Visit (INDEPENDENT_AMBULATORY_CARE_PROVIDER_SITE_OTHER): Payer: Medicare Other | Admitting: Internal Medicine

## 2015-09-11 VITALS — BP 120/60 | HR 96 | Temp 98.0°F | Resp 16 | Ht 61.0 in | Wt 96.0 lb

## 2015-09-11 DIAGNOSIS — Z23 Encounter for immunization: Secondary | ICD-10-CM | POA: Diagnosis not present

## 2015-09-11 DIAGNOSIS — J301 Allergic rhinitis due to pollen: Secondary | ICD-10-CM | POA: Insufficient documentation

## 2015-09-11 DIAGNOSIS — J0141 Acute recurrent pansinusitis: Secondary | ICD-10-CM | POA: Insufficient documentation

## 2015-09-11 MED ORDER — FLUTICASONE PROPIONATE 50 MCG/ACT NA SUSP: 2.0000 | Freq: Every day | NASAL | Status: DC

## 2015-09-11 MED ORDER — CEFUROXIME AXETIL 500 MG PO TABS: 500.0000 mg | ORAL_TABLET | Freq: Two times a day (BID) | ORAL | Status: DC

## 2015-09-11 NOTE — Progress Notes (Signed)
Subjective:  Patient ID: Shari Schmitt, female    DOB: Jun 19, 1941  Age: 74 y.o. MRN: [id number]  CC: Allergic Rhinitis  and Sinusitis   HPI TEIGEN BROOKOVER presents for a 3 day hx of runny, right facial pain, NP cough, post-nasal drip, sneezing, and ST.  Outpatient Prescriptions Prior to Visit  Medication Sig Dispense Refill  . albuterol (PROAIR HFA) 108 (90 BASE) MCG/ACT inhaler Inhale 2 puffs into the lungs every 6 (six) hours as needed. 1 Inhaler 2  . ALPRAZolam (XANAX) 0.5 MG tablet Take 1 tablet (0.5 mg total) by mouth 3 (three) times daily. 60 tablet 5  . atorvastatin (LIPITOR) 10 MG tablet Take 1 tablet (10 mg total) by mouth daily. 90 tablet 1  . Cholecalciferol 2000 UNITS TABS Take 1 tablet (2,000 Units total) by mouth daily. 90 tablet 3  . esomeprazole (NEXIUM) 40 MG capsule TAKE 1 CAPSULE DAILY BEFORE BREAKFAST 90 capsule 1  . fexofenadine-pseudoephedrine (ALLEGRA-D) 60-120 MG per tablet One tablet twice daily as needed 60 tablet 6  . fluocinonide gel (LIDEX) 0.05 % APPLY TO AFFECTED AREA AS NEEDED 60 g 0  . levothyroxine (SYNTHROID) 50 MCG tablet Take 1 tablet (50 mcg total) by mouth daily before breakfast. 30 tablet 3  . levothyroxine (SYNTHROID, LEVOTHROID) 75 MCG tablet     . Multiple Vitamin (MULTIVITAMIN) tablet Take 1 tablet by mouth daily.      . ursodiol (ACTIGALL) 300 MG capsule TAKE 1 CAPSULE THREE TIMES A DAY 270 capsule 1  . amoxicillin (AMOXIL) 500 MG capsule Take 1 capsule (500 mg total) by mouth 3 (three) times daily. 30 capsule 0   No facility-administered medications prior to visit.    ROS Review of Systems  Constitutional: Negative.  Negative for fever, chills, diaphoresis, appetite change and fatigue.  HENT: Positive for congestion, postnasal drip, rhinorrhea, sinus pressure, sneezing and sore throat. Negative for facial swelling, tinnitus, trouble swallowing and voice change.   Eyes: Negative.   Respiratory: Negative.  Negative for apnea, cough,  choking, chest tightness, shortness of breath and stridor.   Cardiovascular: Negative.  Negative for chest pain, palpitations and leg swelling.  Gastrointestinal: Negative.  Negative for nausea, vomiting, abdominal pain, diarrhea, constipation and blood in stool.  Endocrine: Negative.   Genitourinary: Negative.   Musculoskeletal: Negative.   Skin: Negative.   Allergic/Immunologic: Negative.   Neurological: Negative.  Negative for dizziness.  Hematological: Negative.  Negative for adenopathy. Does not bruise/bleed easily.  Psychiatric/Behavioral: Negative.     Objective:  BP 120/60 mmHg  Pulse 96  Temp(Src) 98 F (36.7 C) (Oral)  Resp 16  Ht 5\' 1"  (1.549 m)  Wt 96 lb (43.545 kg)  BMI 18.15 kg/m2  SpO2 98%  BP Readings from Last 3 Encounters:  09/11/15 120/60  08/07/15 116/48  07/12/15 120/72    Wt Readings from Last 3 Encounters:  09/11/15 96 lb (43.545 kg)  08/07/15 96 lb 6 oz (43.715 kg)  07/12/15 97 lb (43.999 kg)    Physical Exam  Constitutional: She is oriented to person, place, and time.  Non-toxic appearance. She does not have a sickly appearance. She does not appear ill. No distress.  HENT:  Head: Normocephalic and atraumatic.  Right Ear: Hearing, tympanic membrane, external ear and ear canal normal.  Left Ear: Hearing, tympanic membrane, external ear and ear canal normal.  Nose: Rhinorrhea present. No mucosal edema. Right sinus exhibits maxillary sinus tenderness. Right sinus exhibits no frontal sinus tenderness. Left sinus exhibits no maxillary  sinus tenderness and no frontal sinus tenderness.  Mouth/Throat: Oropharynx is clear and moist and mucous membranes are normal. Mucous membranes are not pale, not dry and not cyanotic. No oral lesions. No trismus in the jaw. No uvula swelling. No oropharyngeal exudate, posterior oropharyngeal edema, posterior oropharyngeal erythema or tonsillar abscesses.  Eyes: Conjunctivae are normal. Right eye exhibits no discharge. Left  eye exhibits no discharge. No scleral icterus.  Neck: Normal range of motion. Neck supple. No JVD present. No tracheal deviation present. No thyromegaly present.  Cardiovascular: Normal rate, normal heart sounds and intact distal pulses.  Exam reveals no gallop and no friction rub.   No murmur heard. Pulmonary/Chest: Effort normal and breath sounds normal. No stridor. No respiratory distress. She has no wheezes. She has no rales. She exhibits no tenderness.  Abdominal: Soft. Bowel sounds are normal. She exhibits no distension and no mass. There is no tenderness. There is no rebound and no guarding.  Musculoskeletal: Normal range of motion. She exhibits no edema or tenderness.  Lymphadenopathy:    She has no cervical adenopathy.  Neurological: She is oriented to person, place, and time.  Skin: Skin is warm and dry. No rash noted. She is not diaphoretic. No erythema. No pallor.  Vitals reviewed.   Lab Results  Component Value Date   WBC 5.2 08/07/2015   HGB 13.0 08/07/2015   HCT 39.5 08/07/2015   PLT 199 08/07/2015   GLUCOSE 96 08/07/2015   CHOL 148 05/16/2015   TRIG 128.0 05/16/2015   HDL 52.30 05/16/2015   LDLCALC 70 05/16/2015   ALT 10 07/12/2015   AST 19 07/12/2015   NA 135 08/07/2015   K 3.6 08/07/2015   CL 110 08/07/2015   CREATININE 2.70* 08/07/2015   BUN 8 08/07/2015   CO2 19* 08/07/2015   TSH 0.924 08/07/2015   INR 1.0 03/06/2011    US Renal  08/17/2015  CLINICAL DATA:  Acute renal injury. EXAM: RENAL / URINARY TRACT ULTRASOUND COMPLETE COMPARISON:  None. FINDINGS: Right Kidney: Length: 8.7 cm. Mild renal cortical irregularity suggesting scarring . Echogenicity within normal limits. No mass or hydronephrosis visualized. Left Kidney: Length: 8.3 cm. Mild renal cortical irregularity suggesting scarring. Echogenicity within normal limits. No mass or hydronephrosis visualized. Bladder: Appears normal for degree of bladder distention. IMPRESSION: Mild bilateral renal cortical  thinning suggesting scarring. No acute or focal abnormality. Electronically Signed   By: Marcello Moores  Register   On: 08/17/2015 10:24    Assessment & Plan:   Neya was seen today for allergic rhinitis  and sinusitis.  Diagnoses and all orders for this visit:  Allergic rhinitis due to pollen- she will cont taking allergra-d, will also start flonase ns -     fluticasone (FLONASE) 50 MCG/ACT nasal spray; Place 2 sprays into both nostrils daily.  Acute recurrent pansinusitis - I will treat the infection with ceftin -     cefUROXime (CEFTIN) 500 MG tablet; Take 1 tablet (500 mg total) by mouth 2 (two) times daily.  Need for influenza vaccination -     Flu Vaccine QUAD 36+ mos IM  I have discontinued Ms. Rabelo's amoxicillin. I am also having her start on fluticasone and cefUROXime. Additionally, I am having her maintain her multivitamin, fexofenadine-pseudoephedrine, fluocinonide gel, esomeprazole, atorvastatin, albuterol, ursodiol, ALPRAZolam, levothyroxine, Cholecalciferol, and levothyroxine.  Meds ordered this encounter  Medications  . fluticasone (FLONASE) 50 MCG/ACT nasal spray    Sig: Place 2 sprays into both nostrils daily.    Dispense:  16 g  Refill:  11  . cefUROXime (CEFTIN) 500 MG tablet    Sig: Take 1 tablet (500 mg total) by mouth 2 (two) times daily.    Dispense:  20 tablet    Refill:  1     Follow-up: Return if symptoms worsen or fail to improve.  Scarlette Calico, MD

## 2015-09-11 NOTE — Progress Notes (Signed)
Pre visit review using our clinic review tool, if applicable. No additional management support is needed unless otherwise documented below in the visit note. 

## 2015-09-11 NOTE — Patient Instructions (Signed)

## 2015-10-05 DIAGNOSIS — F22 Delusional disorders: Secondary | ICD-10-CM | POA: Diagnosis not present

## 2015-10-05 DIAGNOSIS — N183 Chronic kidney disease, stage 3 (moderate): Secondary | ICD-10-CM | POA: Diagnosis not present

## 2015-10-05 DIAGNOSIS — K743 Primary biliary cirrhosis: Secondary | ICD-10-CM | POA: Diagnosis not present

## 2015-10-05 DIAGNOSIS — I959 Hypotension, unspecified: Secondary | ICD-10-CM | POA: Diagnosis not present

## 2015-10-05 DIAGNOSIS — N179 Acute kidney failure, unspecified: Secondary | ICD-10-CM | POA: Diagnosis not present

## 2015-10-05 DIAGNOSIS — K9041 Non-celiac gluten sensitivity: Secondary | ICD-10-CM | POA: Diagnosis not present

## 2015-10-05 DIAGNOSIS — N250 Renal osteodystrophy: Secondary | ICD-10-CM | POA: Diagnosis not present

## 2015-10-05 DIAGNOSIS — N2581 Secondary hyperparathyroidism of renal origin: Secondary | ICD-10-CM | POA: Diagnosis not present

## 2015-10-05 DIAGNOSIS — D638 Anemia in other chronic diseases classified elsewhere: Secondary | ICD-10-CM | POA: Diagnosis not present

## 2015-10-30 ENCOUNTER — Other Ambulatory Visit: Payer: Self-pay

## 2015-10-30 DIAGNOSIS — E890 Postprocedural hypothyroidism: Secondary | ICD-10-CM

## 2015-10-30 MED ORDER — LEVOTHYROXINE SODIUM 50 MCG PO TABS: 50.0000 ug | ORAL_TABLET | Freq: Every day | ORAL | Status: DC

## 2015-10-31 ENCOUNTER — Other Ambulatory Visit: Payer: Self-pay

## 2015-10-31 DIAGNOSIS — E890 Postprocedural hypothyroidism: Secondary | ICD-10-CM

## 2015-10-31 MED ORDER — LEVOTHYROXINE SODIUM 50 MCG PO TABS: 50.0000 ug | ORAL_TABLET | Freq: Every day | ORAL | Status: DC

## 2015-11-13 ENCOUNTER — Telehealth: Payer: Self-pay | Admitting: Gastroenterology

## 2015-11-13 ENCOUNTER — Ambulatory Visit: Payer: Medicare Other | Admitting: Internal Medicine

## 2015-11-13 NOTE — Telephone Encounter (Signed)
OK 

## 2015-11-13 NOTE — Telephone Encounter (Signed)
Spoke w/Patient and will CB to schedule an appt

## 2015-12-08 ENCOUNTER — Other Ambulatory Visit: Payer: Self-pay | Admitting: Family

## 2015-12-22 ENCOUNTER — Other Ambulatory Visit: Payer: Self-pay | Admitting: Internal Medicine

## 2015-12-22 DIAGNOSIS — F411 Generalized anxiety disorder: Secondary | ICD-10-CM

## 2015-12-22 DIAGNOSIS — E890 Postprocedural hypothyroidism: Secondary | ICD-10-CM

## 2015-12-22 MED ORDER — FLUOCINONIDE 0.05 % EX GEL: CUTANEOUS | Status: DC

## 2015-12-22 MED ORDER — LEVOTHYROXINE SODIUM 50 MCG PO TABS: 50.0000 ug | ORAL_TABLET | Freq: Every day | ORAL | Status: DC

## 2015-12-22 MED ORDER — ALPRAZOLAM 0.5 MG PO TABS: 0.5000 mg | ORAL_TABLET | Freq: Three times a day (TID) | ORAL | Status: DC

## 2015-12-22 NOTE — Telephone Encounter (Signed)
Synthroid sent. Duanne Moron and lidex pended for MD approval

## 2015-12-22 NOTE — Telephone Encounter (Signed)
Patient is switching pharmacies to [facility] on [address].  She would like this to be updated.  She is requesting levothyroxine, alprazolam and fluocinonide to be sent over.

## 2015-12-23 ENCOUNTER — Other Ambulatory Visit: Payer: Self-pay | Admitting: Internal Medicine

## 2015-12-25 NOTE — Telephone Encounter (Signed)
Faxed script back to [facility].../lmb 

## 2016-01-11 DIAGNOSIS — D638 Anemia in other chronic diseases classified elsewhere: Secondary | ICD-10-CM | POA: Diagnosis not present

## 2016-01-11 DIAGNOSIS — N2581 Secondary hyperparathyroidism of renal origin: Secondary | ICD-10-CM | POA: Diagnosis not present

## 2016-01-11 DIAGNOSIS — F22 Delusional disorders: Secondary | ICD-10-CM | POA: Diagnosis not present

## 2016-01-11 DIAGNOSIS — N250 Renal osteodystrophy: Secondary | ICD-10-CM | POA: Diagnosis not present

## 2016-01-11 DIAGNOSIS — K743 Primary biliary cirrhosis: Secondary | ICD-10-CM | POA: Diagnosis not present

## 2016-01-11 DIAGNOSIS — N183 Chronic kidney disease, stage 3 (moderate): Secondary | ICD-10-CM | POA: Diagnosis not present

## 2016-01-11 DIAGNOSIS — I959 Hypotension, unspecified: Secondary | ICD-10-CM | POA: Diagnosis not present

## 2016-01-11 DIAGNOSIS — N179 Acute kidney failure, unspecified: Secondary | ICD-10-CM | POA: Diagnosis not present

## 2016-01-11 DIAGNOSIS — K9041 Non-celiac gluten sensitivity: Secondary | ICD-10-CM | POA: Diagnosis not present

## 2016-01-11 LAB — BASIC METABOLIC PANEL
BUN: 17 mg/dL (ref 4–21)
CREATININE: 2.5 mg/dL — AB (ref 0.5–1.1)
Glucose: 79 mg/dL
Potassium: 4.1 mmol/L (ref 3.4–5.3)
Sodium: 135 mmol/L — AB (ref 137–147)

## 2016-01-11 LAB — CBC AND DIFFERENTIAL
HCT: 36 % (ref 36–46)
Hemoglobin: 12.1 g/dL (ref 12.0–16.0)
PLATELETS: 211 10*3/uL (ref 150–399)
WBC: 5.6 10^3/mL

## 2016-01-11 LAB — HEPATIC FUNCTION PANEL
ALK PHOS: 119 U/L (ref 25–125)
ALT: 17 U/L (ref 7–35)
AST: 31 U/L (ref 13–35)
Bilirubin, Total: 0.2 mg/dL

## 2016-01-26 ENCOUNTER — Other Ambulatory Visit: Payer: Self-pay | Admitting: Internal Medicine

## 2016-01-26 LAB — GLOMERULAR BASEMENT MEMBRANE ANTIBODIES: GFR CALC NON AF AMER: 18

## 2016-02-08 DIAGNOSIS — N3091 Cystitis, unspecified with hematuria: Secondary | ICD-10-CM | POA: Diagnosis not present

## 2016-02-08 DIAGNOSIS — F05 Delirium due to known physiological condition: Secondary | ICD-10-CM | POA: Diagnosis not present

## 2016-02-18 DIAGNOSIS — J302 Other seasonal allergic rhinitis: Secondary | ICD-10-CM | POA: Diagnosis not present

## 2016-02-18 DIAGNOSIS — R000 Tachycardia, unspecified: Secondary | ICD-10-CM | POA: Diagnosis not present

## 2016-02-18 DIAGNOSIS — R0982 Postnasal drip: Secondary | ICD-10-CM | POA: Diagnosis not present

## 2016-02-19 ENCOUNTER — Other Ambulatory Visit: Payer: Self-pay | Admitting: Internal Medicine

## 2016-02-21 ENCOUNTER — Encounter: Payer: Self-pay | Admitting: Internal Medicine

## 2016-02-22 ENCOUNTER — Telehealth: Payer: Self-pay

## 2016-02-22 ENCOUNTER — Other Ambulatory Visit: Payer: Self-pay | Admitting: Internal Medicine

## 2016-02-22 ENCOUNTER — Other Ambulatory Visit: Payer: Self-pay

## 2016-02-22 MED ORDER — URSODIOL 300 MG PO CAPS: 300.0000 mg | ORAL_CAPSULE | Freq: Three times a day (TID) | ORAL | Status: DC

## 2016-02-22 NOTE — Telephone Encounter (Signed)
rx sent

## 2016-02-22 NOTE — Telephone Encounter (Signed)
yes

## 2016-02-22 NOTE — Telephone Encounter (Signed)
Recd rx refill request for ursodiol cap from [facility]----are you ok with refilling, please advise, thanks

## 2016-02-29 ENCOUNTER — Encounter: Payer: Self-pay | Admitting: Internal Medicine

## 2016-02-29 ENCOUNTER — Ambulatory Visit (INDEPENDENT_AMBULATORY_CARE_PROVIDER_SITE_OTHER): Payer: Medicare Other | Admitting: Internal Medicine

## 2016-02-29 VITALS — BP 136/72 | HR 78 | Temp 98.0°F | Resp 16 | Ht 61.5 in | Wt 102.4 lb

## 2016-02-29 DIAGNOSIS — J0141 Acute recurrent pansinusitis: Secondary | ICD-10-CM | POA: Diagnosis not present

## 2016-02-29 DIAGNOSIS — J301 Allergic rhinitis due to pollen: Secondary | ICD-10-CM

## 2016-02-29 MED ORDER — CEFDINIR 300 MG PO CAPS: 300.0000 mg | ORAL_CAPSULE | Freq: Two times a day (BID) | ORAL | Status: AC

## 2016-02-29 MED ORDER — FLUTICASONE PROPIONATE 50 MCG/ACT NA SUSP: 2.0000 | Freq: Every day | NASAL | Status: DC

## 2016-02-29 MED ORDER — METHYLPREDNISOLONE ACETATE 80 MG/ML IJ SUSP
120.0000 mg | Freq: Once | INTRAMUSCULAR | Status: AC
  Administered 2016-02-29: 120 mg via INTRAMUSCULAR

## 2016-02-29 MED ORDER — METHYLPREDNISOLONE ACETATE 80 MG/ML IJ SUSP: 120.0000 mg | Freq: Once | INTRAMUSCULAR | Status: DC

## 2016-02-29 NOTE — Progress Notes (Signed)
Subjective:  Patient ID: Shari Schmitt, female    DOB: 1941-06-14  Age: 75 y.o. MRN: [id number]  CC: Sinusitis and Allergic Rhinitis    HPI Shari Schmitt presents for a 2 week history of facial pain, thick green nasal phlegm, cough productive of green phlegm, with nasal congestion/sneezing/postnasal drip/congestion. She's been taking Allegra-D without much relief from her symptoms.  Outpatient Prescriptions Prior to Visit  Medication Sig Dispense Refill  . albuterol (PROAIR HFA) 108 (90 BASE) MCG/ACT inhaler Inhale 2 puffs into the lungs every 6 (six) hours as needed. 1 Inhaler 2  . ALPRAZolam (XANAX) 0.5 MG tablet Take 1 tablet (0.5 mg total) by mouth 3 (three) times daily. 60 tablet 5  . atorvastatin (LIPITOR) 10 MG tablet Take 1 tablet (10 mg total) by mouth daily. 90 tablet 1  . Cholecalciferol 2000 UNITS TABS Take 1 tablet (2,000 Units total) by mouth daily. 90 tablet 3  . esomeprazole (NEXIUM) 40 MG capsule TAKE 1 CAPSULE DAILY BEFORE BREAKFAST 90 capsule 1  . fexofenadine-pseudoephedrine (ALLEGRA-D) 60-120 MG per tablet One tablet twice daily as needed 60 tablet 6  . fluocinonide gel (LIDEX) 0.05 % APPLY TO AFFECTED AREA AS NEEDED 60 g 1  . Multiple Vitamin (MULTIVITAMIN) tablet Take 1 tablet by mouth daily.      . ursodiol (ACTIGALL) 300 MG capsule Take 1 capsule (300 mg total) by mouth 3 (three) times daily. 270 capsule 1  . fluticasone (FLONASE) 50 MCG/ACT nasal spray Place 2 sprays into both nostrils daily. 16 g 11  . levothyroxine (SYNTHROID, LEVOTHROID) 50 MCG tablet TAKE 1 TABLET (50 MCG TOTAL) BY MOUTH DAILY BEFORE BREAKFAST. 30 tablet 5  . levothyroxine (SYNTHROID, LEVOTHROID) 75 MCG tablet Reported on 02/29/2016    . cefUROXime (CEFTIN) 500 MG tablet Take 1 tablet (500 mg total) by mouth 2 (two) times daily. (Patient not taking: Reported on 02/29/2016) 20 tablet 1  . levothyroxine (SYNTHROID) 50 MCG tablet Take 1 tablet (50 mcg total) by mouth daily before breakfast.  (Patient not taking: Reported on 02/29/2016) 30 tablet 3   No facility-administered medications prior to visit.    ROS Review of Systems  Constitutional: Negative.  Negative for fever, chills and fatigue.  HENT: Positive for congestion, postnasal drip, rhinorrhea, sinus pressure and sneezing. Negative for sore throat and trouble swallowing.   Eyes: Negative.   Respiratory: Positive for cough. Negative for choking, chest tightness, shortness of breath, wheezing and stridor.   Cardiovascular: Negative.  Negative for chest pain, palpitations and leg swelling.  Gastrointestinal: Negative.  Negative for abdominal pain.  Endocrine: Negative.   Genitourinary: Negative.   Musculoskeletal: Negative.  Negative for myalgias, back pain, joint swelling and arthralgias.  Skin: Negative.  Negative for color change, pallor, rash and wound.  Allergic/Immunologic: Negative.   Neurological: Negative.  Negative for dizziness.  Hematological: Negative.  Negative for adenopathy. Does not bruise/bleed easily.  Psychiatric/Behavioral: Negative.     Objective:  BP 136/72 mmHg  Pulse 78  Temp(Src) 98 F (36.7 C) (Oral)  Resp 16  Ht 5' 1.5" (1.562 m)  Wt 102 lb 6.4 oz (46.448 kg)  BMI 19.04 kg/m2  SpO2 98%  BP Readings from Last 3 Encounters:  02/29/16 136/72  09/11/15 120/60  08/07/15 116/48    Wt Readings from Last 3 Encounters:  02/29/16 102 lb 6.4 oz (46.448 kg)  09/11/15 96 lb (43.545 kg)  08/07/15 96 lb 6 oz (43.715 kg)    Physical Exam  Constitutional: She is oriented  to person, place, and time. She appears well-developed and well-nourished.  Non-toxic appearance. She does not have a sickly appearance. She does not appear ill. No distress.  HENT:  Head: Normocephalic and atraumatic.  Right Ear: Tympanic membrane normal.  Left Ear: Tympanic membrane normal.  Nose: Mucosal edema and rhinorrhea present. Right sinus exhibits maxillary sinus tenderness. Right sinus exhibits no frontal sinus  tenderness. Left sinus exhibits maxillary sinus tenderness. Left sinus exhibits no frontal sinus tenderness.  Mouth/Throat: Oropharynx is clear and moist and mucous membranes are normal. Mucous membranes are not pale, not dry and not cyanotic. No oropharyngeal exudate, posterior oropharyngeal edema, posterior oropharyngeal erythema or tonsillar abscesses.  Eyes: Conjunctivae are normal. Right eye exhibits no discharge. Left eye exhibits no discharge. No scleral icterus.  Neck: Normal range of motion. Neck supple. No JVD present. No tracheal deviation present. No thyromegaly present.  Cardiovascular: Normal rate, regular rhythm, normal heart sounds and intact distal pulses.  Exam reveals no gallop and no friction rub.   No murmur heard. Pulmonary/Chest: Effort normal and breath sounds normal. No stridor. No respiratory distress. She has no wheezes. She has no rales. She exhibits no tenderness.  Abdominal: Soft. Bowel sounds are normal. She exhibits no distension and no mass. There is no tenderness. There is no rebound and no guarding.  Musculoskeletal: Normal range of motion. She exhibits no edema or tenderness.  Lymphadenopathy:    She has no cervical adenopathy.  Neurological: She is oriented to person, place, and time.  Skin: Skin is warm and dry. No rash noted. She is not diaphoretic. No erythema. No pallor.  Vitals reviewed.   Lab Results  Component Value Date   WBC 5.6 01/11/2016   HGB 12.1 01/11/2016   HCT 36 01/11/2016   PLT 211 01/11/2016   GLUCOSE 96 08/07/2015   CHOL 148 05/16/2015   TRIG 128.0 05/16/2015   HDL 52.30 05/16/2015   LDLCALC 70 05/16/2015   ALT 17 01/11/2016   AST 31 01/11/2016   NA 135* 01/11/2016   K 4.1 01/11/2016   CL 110 08/07/2015   CREATININE 2.5* 01/11/2016   BUN 17 01/11/2016   CO2 19* 08/07/2015   TSH 0.924 08/07/2015   INR 1.0 03/06/2011    US Renal  08/17/2015  CLINICAL DATA:  Acute renal injury. EXAM: RENAL / URINARY TRACT ULTRASOUND  COMPLETE COMPARISON:  None. FINDINGS: Right Kidney: Length: 8.7 cm. Mild renal cortical irregularity suggesting scarring . Echogenicity within normal limits. No mass or hydronephrosis visualized. Left Kidney: Length: 8.3 cm. Mild renal cortical irregularity suggesting scarring. Echogenicity within normal limits. No mass or hydronephrosis visualized. Bladder: Appears normal for degree of bladder distention. IMPRESSION: Mild bilateral renal cortical thinning suggesting scarring. No acute or focal abnormality. Electronically Signed   By: Marcello Moores  Register   On: 08/17/2015 10:24    Assessment & Plan:   Shari Schmitt was seen today for sinusitis and allergic rhinitis .  Diagnoses and all orders for this visit:  Non-seasonal allergic rhinitis due to pollen- she is having a flareup of her symptoms so I gave her an injection of Depo-Medrol, I've also asked her to restart using Flonase nasal spray       -    fluticasone (FLONASE) 50 MCG/ACT nasal spray; Place 2 sprays into both nostrils daily. -     methylPREDNISolone acetate (DEPO-MEDROL) injection 120 mg; Inject 1.5 mLs (120 mg total) into the muscle once.  Acute recurrent pansinusitis- will treat the infection with Ceftin ear -  fluticasone (FLONASE) 50 MCG/ACT nasal spray; Place 2 sprays into both nostrils daily. -     cefdinir (OMNICEF) 300 MG capsule; Take 1 capsule (300 mg total) by mouth 2 (two) times daily.  Other orders -     Cancel: EKG 12-Lead -     Discontinue: methylPREDNISolone acetate (DEPO-MEDROL) injection 120 mg; Inject 1.5 mLs (120 mg total) into the muscle once.   I have discontinued Shari Schmitt's cefUROXime. I am also having her start on cefdinir. Additionally, I am having her maintain her multivitamin, fexofenadine-pseudoephedrine, esomeprazole, atorvastatin, albuterol, Cholecalciferol, levothyroxine, ALPRAZolam, fluocinonide gel, ursodiol, and fluticasone. We administered methylPREDNISolone acetate.  Meds ordered this encounter    Medications  . DISCONTD: methylPREDNISolone acetate (DEPO-MEDROL) injection 120 mg    Sig:   . fluticasone (FLONASE) 50 MCG/ACT nasal spray    Sig: Place 2 sprays into both nostrils daily.    Dispense:  16 g    Refill:  11  . cefdinir (OMNICEF) 300 MG capsule    Sig: Take 1 capsule (300 mg total) by mouth 2 (two) times daily.    Dispense:  20 capsule    Refill:  1  . methylPREDNISolone acetate (DEPO-MEDROL) injection 120 mg    Sig:      Follow-up: Return in about 3 weeks (around 03/21/2016).  Scarlette Calico, MD

## 2016-02-29 NOTE — Patient Instructions (Signed)

## 2016-02-29 NOTE — Progress Notes (Signed)
Pre visit review using our clinic review tool, if applicable. No additional management support is needed unless otherwise documented below in the visit note. 

## 2016-03-11 ENCOUNTER — Other Ambulatory Visit: Payer: Self-pay

## 2016-03-11 MED ORDER — LEVOTHYROXINE SODIUM 75 MCG PO TABS: 75.0000 ug | ORAL_TABLET | Freq: Every day | ORAL | Status: DC

## 2016-03-11 MED ORDER — ATORVASTATIN CALCIUM 10 MG PO TABS: 10.0000 mg | ORAL_TABLET | Freq: Every day | ORAL | Status: DC

## 2016-03-11 MED ORDER — ALBUTEROL SULFATE HFA 108 (90 BASE) MCG/ACT IN AERS: 2.0000 | INHALATION_SPRAY | Freq: Four times a day (QID) | RESPIRATORY_TRACT | Status: DC | PRN

## 2016-03-15 DIAGNOSIS — F22 Delusional disorders: Secondary | ICD-10-CM | POA: Diagnosis not present

## 2016-03-15 DIAGNOSIS — K743 Primary biliary cirrhosis: Secondary | ICD-10-CM | POA: Diagnosis not present

## 2016-03-15 DIAGNOSIS — I959 Hypotension, unspecified: Secondary | ICD-10-CM | POA: Diagnosis not present

## 2016-03-15 DIAGNOSIS — N250 Renal osteodystrophy: Secondary | ICD-10-CM | POA: Diagnosis not present

## 2016-03-15 DIAGNOSIS — N183 Chronic kidney disease, stage 3 (moderate): Secondary | ICD-10-CM | POA: Diagnosis not present

## 2016-03-15 DIAGNOSIS — N2581 Secondary hyperparathyroidism of renal origin: Secondary | ICD-10-CM | POA: Diagnosis not present

## 2016-03-15 DIAGNOSIS — N179 Acute kidney failure, unspecified: Secondary | ICD-10-CM | POA: Diagnosis not present

## 2016-03-15 DIAGNOSIS — D638 Anemia in other chronic diseases classified elsewhere: Secondary | ICD-10-CM | POA: Diagnosis not present

## 2016-03-15 DIAGNOSIS — K9041 Non-celiac gluten sensitivity: Secondary | ICD-10-CM | POA: Diagnosis not present

## 2016-03-15 LAB — BASIC METABOLIC PANEL
BUN: 9 mg/dL (ref 4–21)
Creatinine: 2.6 mg/dL — AB (ref <=1.1)
GLUCOSE: 70 mg/dL
Potassium: 4.8 mmol/L (ref 3.4–5.3)
Sodium: 138 mmol/L (ref 137–147)

## 2016-03-15 LAB — CBC AND DIFFERENTIAL
HEMATOCRIT: 35 % — AB (ref 36–46)
Hemoglobin: 11.9 g/dL — AB (ref 12.0–16.0)
NEUTROS ABS: 5 /uL
WBC: 6.4 10^3/mL

## 2016-03-15 LAB — HEPATIC FUNCTION PANEL
ALT: 10 U/L (ref 7–35)
AST: 20 U/L (ref 13–35)
Alkaline Phosphatase: 113 U/L (ref 25–125)
BILIRUBIN, TOTAL: 0.2 mg/dL

## 2016-03-19 ENCOUNTER — Telehealth: Payer: Self-pay

## 2016-03-19 ENCOUNTER — Other Ambulatory Visit: Payer: Self-pay

## 2016-03-19 MED ORDER — URSODIOL 300 MG PO CAPS: 300.0000 mg | ORAL_CAPSULE | Freq: Three times a day (TID) | ORAL | Status: DC

## 2016-03-19 NOTE — Telephone Encounter (Signed)
Patient state says [facility] [phone]) says Dr. Ronnald Ramp denied her request for refill though them. She would like to use them they are cheaper she states. Please follow up. Thank you.

## 2016-03-19 NOTE — Telephone Encounter (Signed)
S[facility], sent script to [facility] on 4/25

## 2016-03-21 ENCOUNTER — Ambulatory Visit: Payer: Medicare Other | Admitting: Internal Medicine

## 2016-03-28 ENCOUNTER — Other Ambulatory Visit: Payer: Self-pay | Admitting: Geriatric Medicine

## 2016-03-28 MED ORDER — LEVOTHYROXINE SODIUM 75 MCG PO TABS: 75.0000 ug | ORAL_TABLET | Freq: Every day | ORAL | Status: DC

## 2016-04-03 ENCOUNTER — Telehealth: Payer: Self-pay

## 2016-04-03 NOTE — Telephone Encounter (Signed)
Contacted [facility] and had them DC all refill for the Levothyroxin.

## 2016-04-03 NOTE — Telephone Encounter (Signed)
Patient called and wanted to know about the 75 mcg of levothyroxine that was sent to her mail order. Pt has been taking the 50 mcg since the last appt with Dr. Ronnald Ramp.  Last TSH was in 07/2015. I do not see any notes about why there was a change in dose.   Please advise in the correct dosage of the levothyroxin at your earliest convenience.

## 2016-04-11 NOTE — Telephone Encounter (Signed)
Waiting on response for the levo dose. Please advise

## 2016-04-11 NOTE — Telephone Encounter (Signed)
Tried to call pt. Pt voicemail not set up yet.

## 2016-04-11 NOTE — Telephone Encounter (Signed)
She needs to be seen to have her thyroid level checked. She needs to tell me what dose of levothyroxine she has been taking lately.

## 2016-04-12 NOTE — Telephone Encounter (Signed)
Informed pt that TSH will be reevaluated at the visit coming up. Pt stated understanding.

## 2016-04-12 NOTE — Telephone Encounter (Signed)
I have scheduled patient for 5/25 with Jones.  Patient states she usually take 50mg  of levothyroxine.  States mail order pharmacy sent her 75mg .  Please follow back up in regards.

## 2016-04-16 IMAGING — US US RENAL
1 series · 14 of 25 positions shown · non-contrast
Comparison: None.

CLINICAL DATA: Acute renal injury.

EXAM:
RENAL / URINARY TRACT ULTRASOUND COMPLETE

[Series 1: us renal · 0.18mm/px · 14 of 30 slices shown]
[im 1/30]
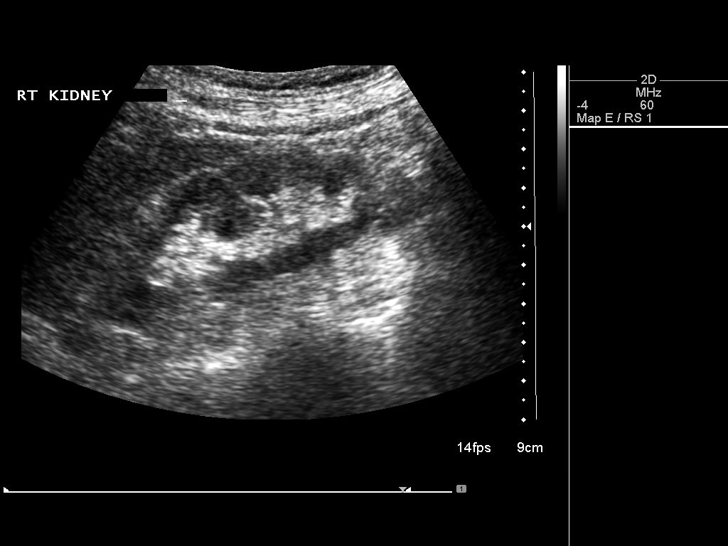
[im 3/30]
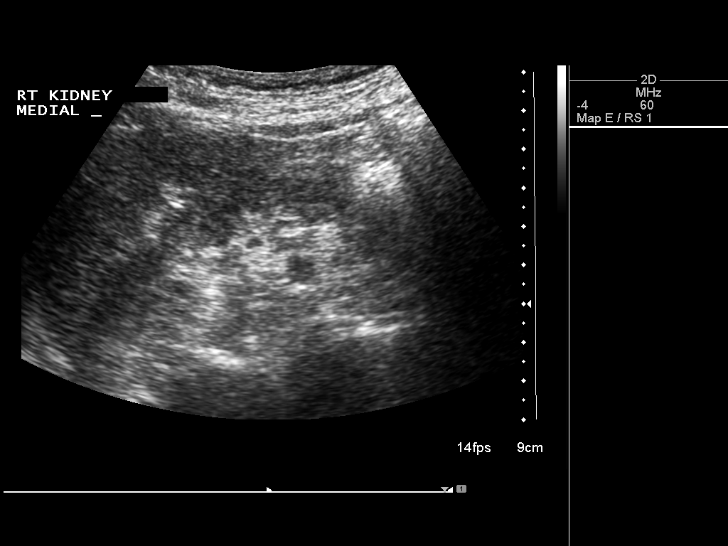
[im 5/30]
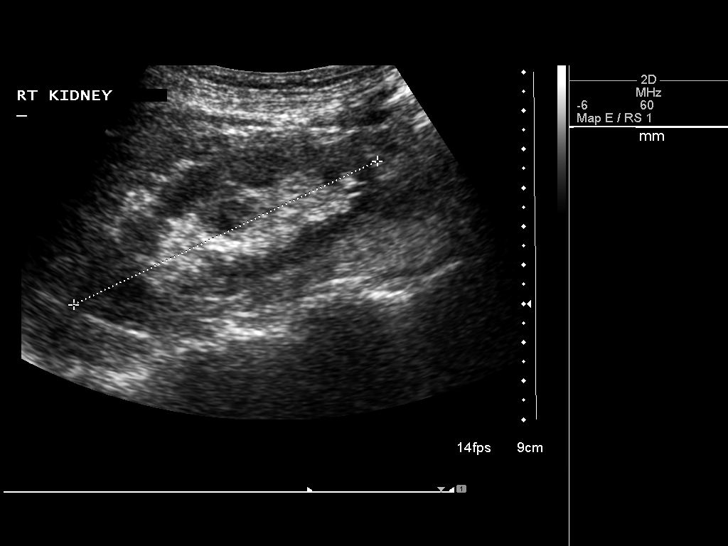
[im 8/30]
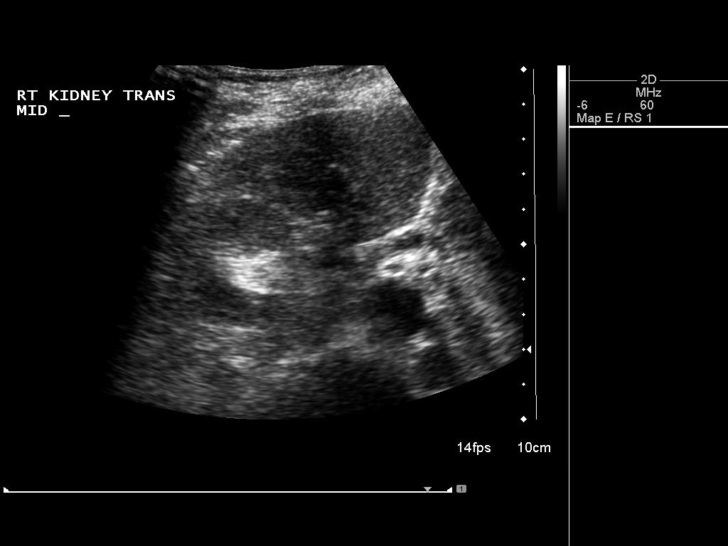
[im 10/30]
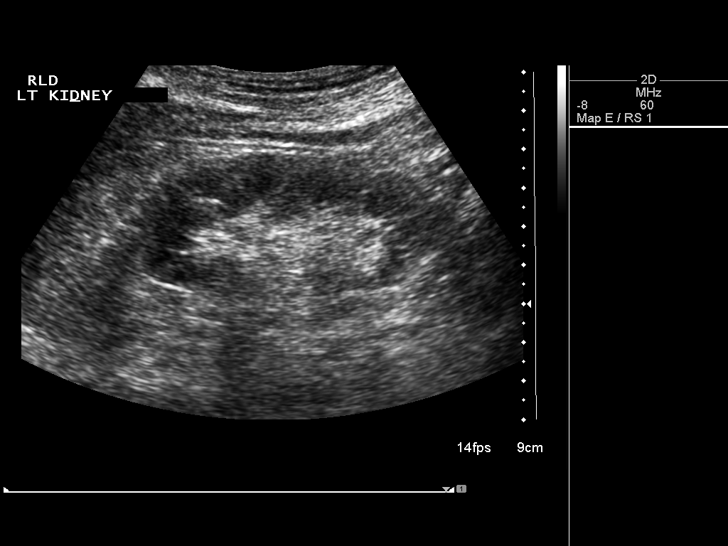
[im 11/30]
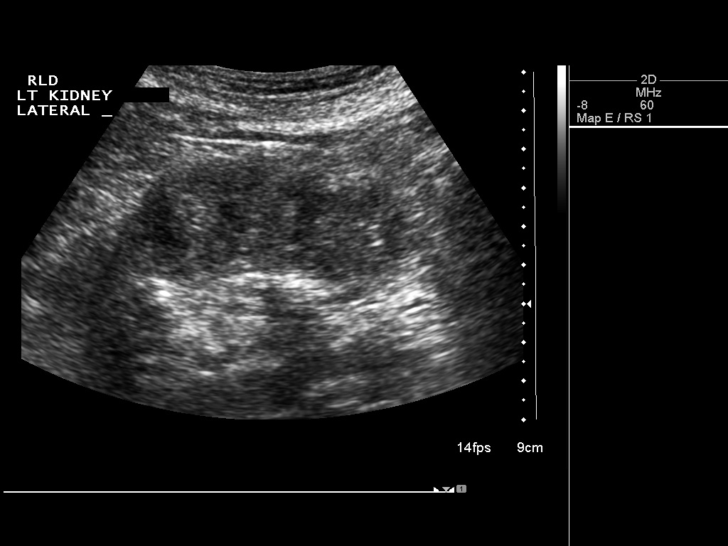
[im 14/30]
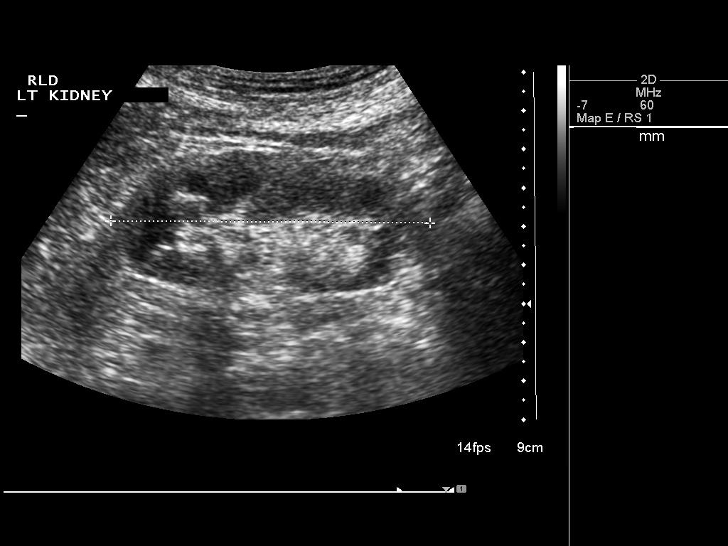
[im 16/30]
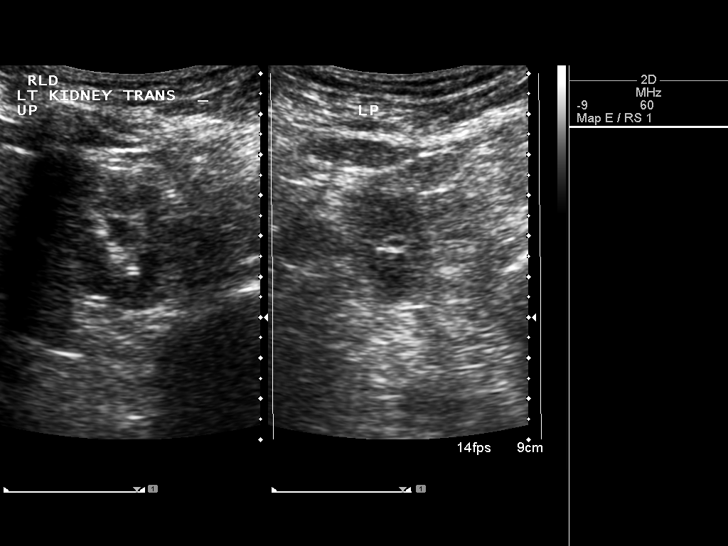
[im 19/30]
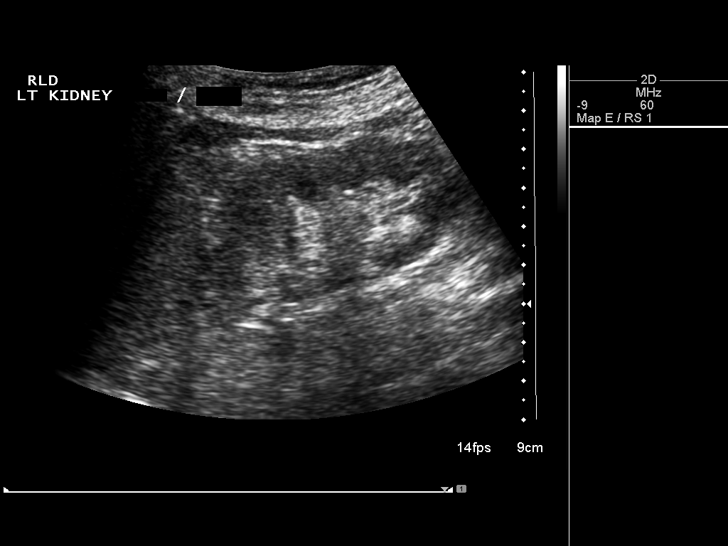
[im 20/30]
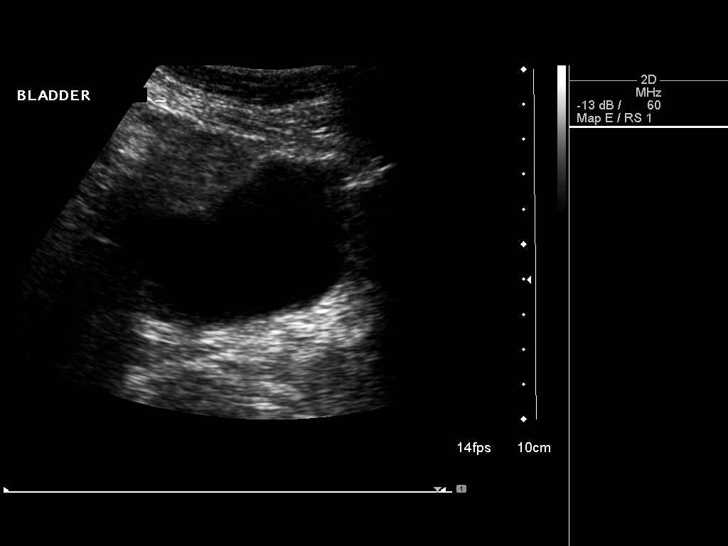
[im 22/30]
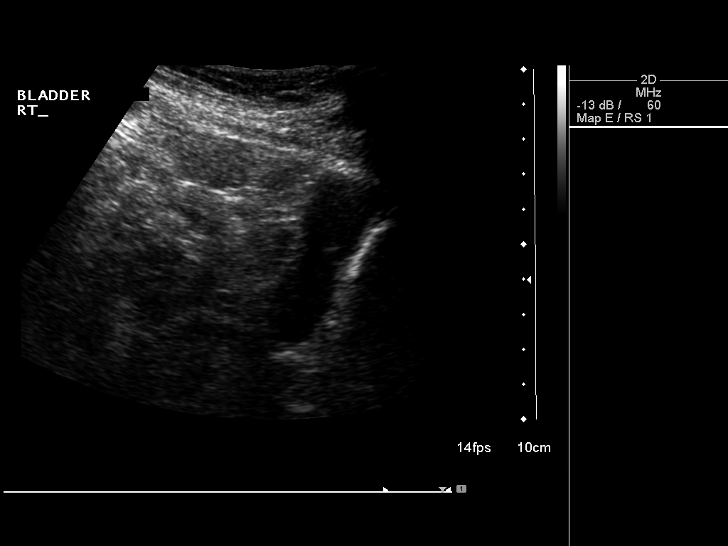
[im 25/30]
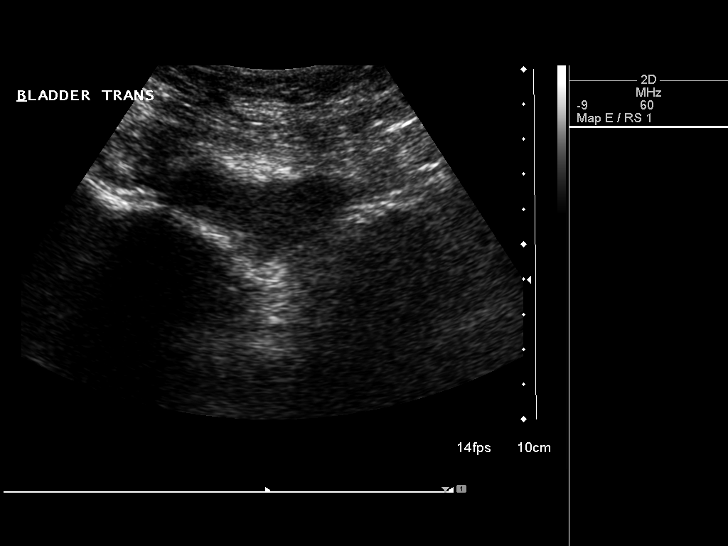
[im 27/30]
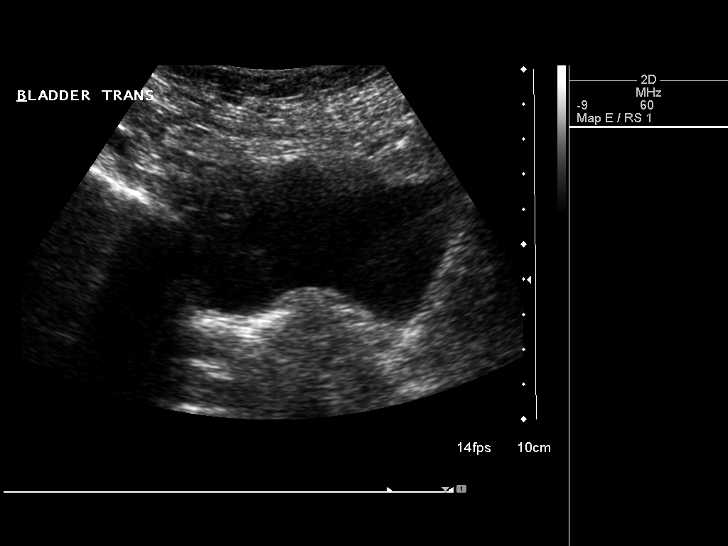
[im 30/30]
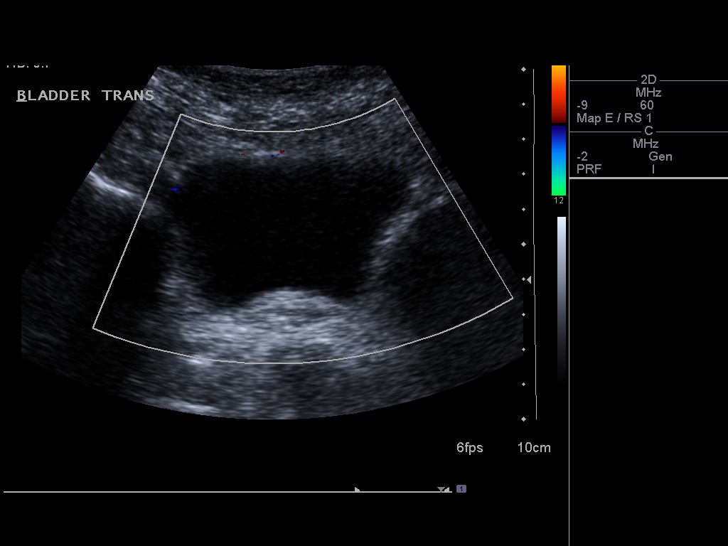

[14 of 25 positions shown; findings below may reference images not displayed]

FINDINGS: Right Kidney:

Length: 8.7 cm. Mild renal cortical irregularity suggesting scarring
. Echogenicity within normal limits. No mass or hydronephrosis
visualized.

Left Kidney:

Length: 8.3 cm. Mild renal cortical irregularity suggesting
scarring. Echogenicity within normal limits. No mass or
hydronephrosis visualized.

Bladder:

Appears normal for degree of bladder distention.
IMPRESSION: Mild bilateral renal cortical thinning suggesting scarring. No acute
or focal abnormality.

## 2016-04-18 ENCOUNTER — Other Ambulatory Visit (INDEPENDENT_AMBULATORY_CARE_PROVIDER_SITE_OTHER): Payer: Medicare Other

## 2016-04-18 ENCOUNTER — Ambulatory Visit (INDEPENDENT_AMBULATORY_CARE_PROVIDER_SITE_OTHER): Payer: Medicare Other | Admitting: Internal Medicine

## 2016-04-18 ENCOUNTER — Encounter: Payer: Self-pay | Admitting: Internal Medicine

## 2016-04-18 VITALS — BP 138/78 | HR 82 | Temp 97.4°F | Resp 16 | Ht 61.5 in | Wt 102.0 lb

## 2016-04-18 DIAGNOSIS — E038 Other specified hypothyroidism: Secondary | ICD-10-CM | POA: Diagnosis not present

## 2016-04-18 DIAGNOSIS — K745 Biliary cirrhosis, unspecified: Secondary | ICD-10-CM

## 2016-04-18 DIAGNOSIS — N183 Chronic kidney disease, stage 3 (moderate): Secondary | ICD-10-CM

## 2016-04-18 LAB — TSH: TSH: 6.58 u[IU]/mL — AB (ref 0.35–4.50)

## 2016-04-18 LAB — COMPREHENSIVE METABOLIC PANEL
ALBUMIN: 4.1 g/dL (ref 3.5–5.2)
ALK PHOS: 86 U/L (ref 39–117)
ALT: 10 U/L (ref 0–35)
AST: 19 U/L (ref 0–37)
BUN: 20 mg/dL (ref 6–23)
CALCIUM: 9.1 mg/dL (ref 8.4–10.5)
CHLORIDE: 111 meq/L (ref 96–112)
CO2: 22 mEq/L (ref 19–32)
CREATININE: 2.53 mg/dL — AB (ref 0.40–1.20)
GFR: 19.6 mL/min — ABNORMAL LOW (ref >60.00)
Glucose, Bld: 64 mg/dL — ABNORMAL LOW (ref 70–99)
POTASSIUM: 4.2 meq/L (ref 3.5–5.1)
SODIUM: 136 meq/L (ref 135–145)
TOTAL PROTEIN: 7 g/dL (ref 6.0–8.3)
Total Bilirubin: 0.3 mg/dL (ref 0.2–1.2)

## 2016-04-18 NOTE — Progress Notes (Signed)
Pre visit review using our clinic review tool, if applicable. No additional management support is needed unless otherwise documented below in the visit note. 

## 2016-04-18 NOTE — Patient Instructions (Signed)

## 2016-04-18 NOTE — Progress Notes (Signed)
Subjective:  Patient ID: Shari Schmitt, female    DOB: 07-01-1941  Age: 75 y.o. MRN: [id number]  CC: Hypothyroidism   HPI Shari Schmitt presents for follow-up on hypothyroidism. There has been some confusion lately about which dose she was taking. The last time I saw her about 6 months ago I had recommended 75 g a day. She had seen someone else in [facility] who recommended 50 g a day which is the dose she has been taking for the last few months. She denies weight changes, abdominal pain, fatigue, edema, constipation, or diarrhea.  Outpatient Prescriptions Prior to Visit  Medication Sig Dispense Refill  . albuterol (PROAIR HFA) 108 (90 Base) MCG/ACT inhaler Inhale 2 puffs into the lungs every 6 (six) hours as needed. 3 Inhaler 2  . ALPRAZolam (XANAX) 0.5 MG tablet Take 1 tablet (0.5 mg total) by mouth 3 (three) times daily. 60 tablet 5  . atorvastatin (LIPITOR) 10 MG tablet Take 1 tablet (10 mg total) by mouth daily. 90 tablet 1  . Cholecalciferol 2000 UNITS TABS Take 1 tablet (2,000 Units total) by mouth daily. 90 tablet 3  . esomeprazole (NEXIUM) 40 MG capsule TAKE 1 CAPSULE DAILY BEFORE BREAKFAST 90 capsule 1  . fexofenadine-pseudoephedrine (ALLEGRA-D) 60-120 MG per tablet One tablet twice daily as needed 60 tablet 6  . fluocinonide gel (LIDEX) 0.05 % APPLY TO AFFECTED AREA AS NEEDED 60 g 1  . fluticasone (FLONASE) 50 MCG/ACT nasal spray Place 2 sprays into both nostrils daily. 16 g 11  . levothyroxine (SYNTHROID, LEVOTHROID) 75 MCG tablet Take 1 tablet (75 mcg total) by mouth daily before breakfast. 90 tablet 3  . Multiple Vitamin (MULTIVITAMIN) tablet Take 1 tablet by mouth daily.      . ursodiol (ACTIGALL) 300 MG capsule Take 1 capsule (300 mg total) by mouth 3 (three) times daily. 270 capsule 1   No facility-administered medications prior to visit.    ROS Review of Systems  Constitutional: Negative.  Negative for diaphoresis, activity change, appetite change, fatigue and  unexpected weight change.  HENT: Negative.   Eyes: Negative.  Negative for visual disturbance.  Respiratory: Negative.  Negative for cough, choking, chest tightness, shortness of breath and stridor.   Cardiovascular: Negative.  Negative for chest pain, palpitations and leg swelling.  Gastrointestinal: Negative.  Negative for nausea, vomiting, abdominal pain, diarrhea and constipation.  Endocrine: Negative.   Genitourinary: Negative.  Negative for difficulty urinating.  Musculoskeletal: Negative.  Negative for myalgias and back pain.  Skin: Negative.  Negative for color change.  Allergic/Immunologic: Negative.   Neurological: Negative.  Negative for dizziness, tremors, weakness, light-headedness and numbness.  Hematological: Negative.  Negative for adenopathy. Does not bruise/bleed easily.  Psychiatric/Behavioral: Negative.     Objective:  BP 138/78 mmHg  Pulse 82  Temp(Src) 97.4 F (36.3 C) (Oral)  Resp 16  Ht 5' 1.5" (1.562 m)  Wt 102 lb (46.267 kg)  BMI 18.96 kg/m2  SpO2 99%  BP Readings from Last 3 Encounters:  04/18/16 138/78  02/29/16 136/72  09/11/15 120/60    Wt Readings from Last 3 Encounters:  04/18/16 102 lb (46.267 kg)  02/29/16 102 lb 6.4 oz (46.448 kg)  09/11/15 96 lb (43.545 kg)    Physical Exam  Constitutional: She is oriented to person, place, and time. No distress.  HENT:  Mouth/Throat: Oropharynx is clear and moist. No oropharyngeal exudate.  Eyes: Conjunctivae are normal. Right eye exhibits no discharge. Left eye exhibits no discharge. No scleral  icterus.  Neck: Normal range of motion. Neck supple. No JVD present. No tracheal deviation present. No thyromegaly present.  Cardiovascular: Normal rate, regular rhythm, normal heart sounds and intact distal pulses.  Exam reveals no gallop and no friction rub.   No murmur heard. Pulmonary/Chest: Effort normal and breath sounds normal. No stridor. No respiratory distress. She has no wheezes. She has no rales.  She exhibits no tenderness.  Abdominal: Soft. Bowel sounds are normal. She exhibits no distension and no mass. There is no tenderness. There is no rebound and no guarding.  Musculoskeletal: Normal range of motion. She exhibits no edema or tenderness.  Lymphadenopathy:    She has no cervical adenopathy.  Neurological: She is oriented to person, place, and time.  Skin: Skin is warm and dry. No rash noted. She is not diaphoretic. No erythema. No pallor.  Psychiatric: She has a normal mood and affect. Her behavior is normal. Judgment and thought content normal.  Vitals reviewed.   Lab Results  Component Value Date   WBC 5.6 01/11/2016   HGB 12.1 01/11/2016   HCT 36 01/11/2016   PLT 211 01/11/2016   GLUCOSE 64* 04/18/2016   CHOL 148 05/16/2015   TRIG 128.0 05/16/2015   HDL 52.30 05/16/2015   LDLCALC 70 05/16/2015   ALT 10 04/18/2016   AST 19 04/18/2016   NA 136 04/18/2016   K 4.2 04/18/2016   CL 111 04/18/2016   CREATININE 2.53* 04/18/2016   BUN 20 04/18/2016   CO2 22 04/18/2016   TSH 6.58* 04/18/2016   INR 1.0 03/06/2011    US Renal  08/17/2015  CLINICAL DATA:  Acute renal injury. EXAM: RENAL / URINARY TRACT ULTRASOUND COMPLETE COMPARISON:  None. FINDINGS: Right Kidney: Length: 8.7 cm. Mild renal cortical irregularity suggesting scarring . Echogenicity within normal limits. No mass or hydronephrosis visualized. Left Kidney: Length: 8.3 cm. Mild renal cortical irregularity suggesting scarring. Echogenicity within normal limits. No mass or hydronephrosis visualized. Bladder: Appears normal for degree of bladder distention. IMPRESSION: Mild bilateral renal cortical thinning suggesting scarring. No acute or focal abnormality. Electronically Signed   By: Marcello Moores  Register   On: 08/17/2015 10:24    Assessment & Plan:   Posy was seen today for hypothyroidism.  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 -     TSH; Future  Chronic renal disease, stage 3 (moderate)- her renal function is low but stable, she is followed closely by nephrology, she agrees to avoid nephrotoxic agents such as a anti-inflammatories, alcohol, IV contrast, feeding.   I am having Ms. Bowlby maintain her multivitamin, fexofenadine-pseudoephedrine, esomeprazole, Cholecalciferol, ALPRAZolam, fluocinonide gel, fluticasone, albuterol, atorvastatin, ursodiol, and levothyroxine.  No orders of the defined types were placed in this encounter.     Follow-up: Return in about 4 months (around 08/19/2016).  Scarlette Calico, MD

## 2016-05-03 ENCOUNTER — Encounter: Payer: Self-pay | Admitting: Internal Medicine

## 2016-05-03 LAB — IRON
IRON SATURATION: 19
IRON: 70

## 2016-05-03 LAB — VITAMIN D 1,25 DIHYDROXY: Vit D, 1,25-Dihydroxy: 33.9

## 2016-05-03 LAB — FERRITIN, SERUM (SERIAL)

## 2016-05-03 LAB — VITAMIN D 25 HYDROXY (VIT D DEFICIENCY, FRACTURES): VITAMIN D, 25 HYDROXY RIA: 39.1

## 2016-05-06 ENCOUNTER — Encounter: Payer: Self-pay | Admitting: Internal Medicine

## 2016-05-13 ENCOUNTER — Ambulatory Visit (INDEPENDENT_AMBULATORY_CARE_PROVIDER_SITE_OTHER)
Admission: RE | Admit: 2016-05-13 | Discharge: 2016-05-13 | Disposition: A | Discharge status: Home / Self Care | Payer: Medicare Other | Source: Ambulatory Visit | Attending: Internal Medicine | Admitting: Internal Medicine

## 2016-05-13 DIAGNOSIS — M810 Age-related osteoporosis without current pathological fracture: Secondary | ICD-10-CM | POA: Diagnosis not present

## 2016-05-16 ENCOUNTER — Other Ambulatory Visit: Payer: Self-pay | Admitting: Internal Medicine

## 2016-05-16 DIAGNOSIS — M810 Age-related osteoporosis without current pathological fracture: Secondary | ICD-10-CM

## 2016-05-16 LAB — HM DEXA SCAN: HM Dexa Scan: -3.3

## 2016-05-24 ENCOUNTER — Other Ambulatory Visit: Payer: Self-pay

## 2016-05-24 MED ORDER — FLUOCINONIDE 0.05 % EX GEL: CUTANEOUS | Status: DC

## 2016-05-27 ENCOUNTER — Other Ambulatory Visit: Payer: Self-pay | Admitting: Internal Medicine

## 2016-05-27 DIAGNOSIS — L130 Dermatitis herpetiformis: Secondary | ICD-10-CM

## 2016-05-31 ENCOUNTER — Telehealth: Payer: Self-pay

## 2016-05-31 NOTE — Telephone Encounter (Signed)
Twice a day

## 2016-05-31 NOTE — Telephone Encounter (Signed)
[facility] called and wanted clarification on lidex 0.05%. Sig is apply to affected area. Is there a maximum number of times per day.

## 2016-06-03 NOTE — Telephone Encounter (Signed)
Contacted [facility] and gave sig as indicated below.

## 2016-06-10 ENCOUNTER — Telehealth: Payer: Self-pay | Admitting: Internal Medicine

## 2016-06-10 NOTE — Telephone Encounter (Signed)
Pt needs refill on ALPRAZolam (XANAX) 0.5 MG tablet [id number]

## 2016-06-11 ENCOUNTER — Other Ambulatory Visit: Payer: Self-pay | Admitting: Internal Medicine

## 2016-06-11 DIAGNOSIS — F411 Generalized anxiety disorder: Secondary | ICD-10-CM

## 2016-06-11 MED ORDER — ALPRAZOLAM 0.5 MG PO TABS: 0.5000 mg | ORAL_TABLET | Freq: Three times a day (TID) | ORAL | Status: DC

## 2016-06-11 NOTE — Telephone Encounter (Signed)
rx faxed to local [facility].

## 2016-06-11 NOTE — Telephone Encounter (Signed)
Phone rq for alprazolam appt.   Last rx is for # 60 but the sig is: tid.   Just wanted to confirm qty before it prints   Please advise.

## 2016-06-11 NOTE — Telephone Encounter (Signed)
Sent PCP a refill rq encounter for alprazolam. Closing this note.

## 2016-07-08 ENCOUNTER — Ambulatory Visit (INDEPENDENT_AMBULATORY_CARE_PROVIDER_SITE_OTHER): Payer: Medicare Other | Admitting: Internal Medicine

## 2016-07-08 ENCOUNTER — Other Ambulatory Visit: Payer: Self-pay | Admitting: *Deleted

## 2016-07-08 VITALS — BP 122/62 | HR 89 | Ht 62.0 in | Wt 99.0 lb

## 2016-07-08 DIAGNOSIS — E890 Postprocedural hypothyroidism: Secondary | ICD-10-CM | POA: Diagnosis not present

## 2016-07-08 DIAGNOSIS — M859 Disorder of bone density and structure, unspecified: Secondary | ICD-10-CM

## 2016-07-08 DIAGNOSIS — M8580 Other specified disorders of bone density and structure, unspecified site: Secondary | ICD-10-CM

## 2016-07-08 LAB — COMPLETE METABOLIC PANEL WITH GFR
ALBUMIN: 3.9 g/dL (ref 3.6–5.1)
ALK PHOS: 72 U/L (ref 33–130)
ALT: 10 U/L (ref 6–29)
AST: 17 U/L (ref 10–35)
BILIRUBIN TOTAL: 0.3 mg/dL (ref 0.2–1.2)
BUN: 19 mg/dL (ref 7–25)
CO2: 17 mmol/L — AB (ref 20–31)
CREATININE: 2.77 mg/dL — AB (ref 0.60–0.93)
Calcium: 8.4 mg/dL — ABNORMAL LOW (ref 8.6–10.4)
Chloride: 107 mmol/L (ref 98–110)
GFR, EST NON AFRICAN AMERICAN: 16 mL/min — AB (ref >=60)
GFR, Est African American: 18 mL/min — ABNORMAL LOW (ref >=60)
GLUCOSE: 77 mg/dL (ref 65–99)
Potassium: 4.1 mmol/L (ref 3.5–5.3)
SODIUM: 134 mmol/L — AB (ref 135–146)
TOTAL PROTEIN: 6.7 g/dL (ref 6.1–8.1)

## 2016-07-08 LAB — TSH: TSH: 0.94 u[IU]/mL (ref 0.35–4.50)

## 2016-07-08 LAB — PHOSPHORUS: Phosphorus: 3.4 mg/dL (ref 2.3–4.6)

## 2016-07-08 MED ORDER — LEVOTHYROXINE SODIUM 75 MCG PO TABS: 75.0000 ug | ORAL_TABLET | Freq: Every day | ORAL | 2 refills | Status: DC

## 2016-07-08 NOTE — Patient Instructions (Addendum)
Please stop at the lab.  Please make sure you get 1000-1200 mg of calcium daily.  Continue 2000 units of vitamin D daily.  Make sure you get 45-50 g of protein daily. Best source is from plants.  Please return in 1 year.  Please start a 24-hour urine collection: Patient information (Up-to-Date): Collection of a 24-hour urine specimen   - You should collect every drop of urine during each 24-hour period. It does not matter how much or little urine is passed each time, as long as every drop is collected. - Begin the urine collection in the morning after you wake up, after you have emptied your bladder for the first time. - Urinate (empty the bladder) for the first time and flush it down the toilet. Note the exact time (eg, 6:15 AM). You will begin the urine collection at this time. - Collect every drop of urine during the day and night in an empty collection bottle. Store the bottle at room temperature or in the refrigerator. - If you need to have a bowel movement, any urine passed with the bowel movement should be collected. Try not to include feces with the urine collection. If feces does get mixed in, do not try to remove the feces from the urine collection bottle. - Finish by collecting the first urine passed the next morning, adding it to the collection bottle. This should be within ten minutes before or after the time of the first morning void on the first day (which was flushed). In this example, you would try to void between 6:05 and 6:25 on the second day. - If you need to urinate one hour before the final collection time, drink a full glass of water so that you can void again at the appropriate time. If you have to urinate 20 minutes before, try to hold the urine until the proper time. - Please note the exact time of the final collection, even if it is not the same time as when collection began on day 1. - The bottle(s) may be kept at room temperature for a day or two, but should be kept  cool or refrigerated for longer periods of time.   How Can I Prevent Falls? Men and women with osteoporosis need to take care not to fall down. Falls can break bones. Some reasons people fall are: Poor vision  Poor balance  Certain diseases that affect how you walk  Some types of medicine, such as sleeping pills.  Some tips to help prevent falls outdoors are: Use a cane or walker  Wear rubber-soled shoes so you don't slip  Walk on grass when sidewalks are slippery  In winter, put salt or kitty litter on icy sidewalks.  Some ways to help prevent falls indoors are: Keep rooms free of clutter, especially on floors  Use plastic or carpet runners on slippery floors  Wear low-heeled shoes that provide good support  Do not walk in socks, stockings, or slippers  Be sure carpets and area rugs have skid-proof backs or are tacked to the floor  Be sure stairs are well lit and have rails on both sides  Put grab bars on bathroom walls near tub, shower, and toilet  Use a rubber bath mat in the shower or tub  Keep a flashlight next to your bed  Use a sturdy step stool with a handrail and wide steps  Add more lights in rooms (and night lights) Buy a cordless phone to keep with you so that  you don't have to rush to the phone       when it rings and so that you can call for help if you fall.   (adapted from http://www.niams.NightlifePreviews.se)  Dietary sources of calcium and vitamin D:  Calcium content (mg) - http://www.niams.MoviePins.co.za  Fortified oatmeal, 1 packet 350  Sardines, canned in oil, with edible bones, 3 oz. 324  Cheddar cheese, 1 oz. shredded 306  Milk, nonfat, 1 cup 302  Milkshake, 1 cup 300  Yogurt, plain, low-fat, 1 cup 300  Soybeans, cooked, 1 cup 261  Tofu, firm, with calcium,  cup 204  Orange juice, fortified with calcium, 6 oz. 200-260 (varies)  Salmon, canned, with edible bones, 3 oz. 181  Pudding,  instant, made with 2% milk,  cup 153  Baked beans, 1 cup [place], 1% milk fat, 1 cup 138  Spaghetti, lasagna, 1 cup 125  Frozen yogurt, vanilla, soft-serve,  cup 103  Ready-to-eat cereal, fortified with calcium, 1 cup 100-1,000 (varies)  Cheese pizza, 1 slice 694  Fortified waffles, 2 100  Turnip greens, boiled,  cup 99  Broccoli, raw, 1 cup 90  Ice cream, vanilla,  cup 85  Soy or rice milk, fortified with calcium, 1 cup 80-500 (varies)   Vitamin D content (International Units, IU) - https://www.ars.usda.gov Cod liver oil, 1 tablespoon 1,360  Swordfish, cooked, 3 oz 566  Salmon (sockeye), cooked, 3 oz 447  Tuna fish, canned in water, drained, 3 oz 154  Orange juice fortified with vitamin D, 1 cup (check product labels, as amount of added vitamin D varies) 137  Milk, nonfat, reduced fat, and whole, vitamin D-fortified, 1 cup 115-124  Yogurt, fortified with 20% of the daily value for vitamin D, 6 oz 80  Margarine, fortified, 1 tablespoon 60  Sardines, canned in oil, drained, 2 sardines 46  Liver, beef, cooked, 3 oz 42  Egg, 1 large (vitamin D is found in yolk) 41  Ready-to-eat cereal, fortified with 10% of the daily value for vitamin D, 0.75-1 cup  40  Cheese, Swiss, 1 oz 6   Exercise for Strong Bones (from [place]) There are two types of exercises that are important for building and maintaining bone density:  weight-bearing and muscle-strengthening exercises. Weight-bearing Exercises These exercises include activities that make you move against gravity while staying upright. Weight-bearing exercises can be high-impact or low-impact. High-impact weight-bearing exercises help build bones and keep them strong. If you have broken a bone due to osteoporosis or are at risk of breaking a bone, you may need to avoid high-impact exercises. If you're not sure, you should check with your healthcare provider. Examples of high-impact weight-bearing  exercises are: . Dancing . Doing high-impact aerobics . Hiking . Jogging/running . Jumping Rope . Stair climbing . Tennis Low-impact weight-bearing exercises can also help keep bones strong and are a safe alternative if you cannot do high-impact exercises. Examples of low-impact weight-bearing exercises are: . Using elliptical training machines . Doing low-impact aerobics . Using stair-step machines . Fast walking on a treadmill or outside Muscle-Strengthening Exercises These exercises include activities where you move your body, a weight or some other resistance against gravity. They are also known as resistance exercises and include: . Lifting weights . Using elastic exercise bands . Using weight machines . Lifting your own body weight . Functional movements, such as standing and rising up on your toes Yoga and Pilates can also improve strength, balance and flexibility. However, certain positions may not be  safe for people with osteoporosis or those at increased risk of broken bones. For example, exercises that have you bend forward may increase the chance of breaking a bone in the spine. A physical therapist should be able to help you learn which exercises are safe and appropriate for you. Non-Impact Exercises Non-impact exercises can help you to improve balance, posture and how well you move in everyday activities. These exercises can also help to increase muscle strength and decrease the risk of falls and broken bones. Some of these exercises include: . Balance exercises that strengthen your legs and test your balance, such as Tai Chi, can decrease your risk of falls. . Posture exercises that improve your posture and reduce rounded or "sloping" shoulders can help you decrease the chance of breaking a bone, especially in the spine. . Functional exercises that improve how well you move can help you with everyday activities and decrease your chance of falling and breaking a bone. For  example, if you have trouble getting up from a chair or climbing stairs, you should do these activities as exercises. A physical therapist can teach you balance, posture and functional exercises. Starting a New Exercise Program If you haven't exercised regularly for a while, check with your healthcare provider before beginning a new exercise program-particularly if you have health problems such as heart disease, diabetes or high blood pressure. If you're at high risk of breaking a bone, you should work with a physical therapist to develop a safe exercise program. Once you have your healthcare provider's approval, start slowly. If you've already broken bones in the spine because of osteoporosis, be very careful to avoid activities that require reaching down, bending forward, rapid twisting motions, heavy lifting and those that increase your chance of a fall. As you get started, your muscles may feel sore for a day or two after you exercise. If soreness lasts longer, you may be working too hard and need to ease up. Exercises should be done in a pain-free range of motion. How Much Exercise Do You Need? Weight-bearing exercises 30 minutes on most days of the week. Do a 30-minutesession or multiple sessions spread out throughout the day. The benefits to your bones are the same.   Muscle-strengthening exercises Two to three days per week. If you don't have much time for strengthening/resistance training, do small amounts at a time. You can do just one body part each day. For example do arms one day, legs the next and trunk the next. You can also spread these exercises out during your normal day.  Balance, posture and functional exercises Every day or as often as needed. You may want to focus on one area more than the others. If you have fallen or lose your balance, spend time doing balance exercises. If you are getting rounded shoulders, work more on posture exercises. If you have trouble climbing stairs or  getting up from the couch, do more functional exercises. You can also perform these exercises at one time or spread them during your day. Work with a phyiscal therapist to learn the right exercises for you.

## 2016-07-08 NOTE — Progress Notes (Signed)
Patient ID: Shari Schmitt, female   DOB: [birth year], 75 y.o.   MRN: [id number]    HPI  Shari Schmitt is a 75 y.o.-year-old female, referred by her PCP, Dr. Ronnald Ramp, for management of osteoporosis.  Pt was dx with very low bone mineral density in her 15s (initially osteopenia). Of note, she also has a history of stage 3-4 chronic kidney disease. Per review of notes from Dr. Carollee Leitz, patient's nephrologist, patient also has renal osteodystrophy.   I reviewed pt's DEXA scans: Date L1-L4 T score FN T score  05/13/2016 -1.1 RFN: -3.3 LFN: -3.1   She denies fractures or falls.  No dizziness/vertigo/orthostasis/poor vision.  No previous OP treatments.  No h/o vitamin D deficiency. Reviewed available vit D levels: 03/15/2016: 25 hydroxy vitamin D 39.1; 1, 25 dihydroxy vitamin D 33.9 Lab Results  Component Value Date   VD25OH 27.28 (L) 07/12/2015   Pt is on vitamin D - started on this 6 mo ago: 2000 IU daily. She also eats dairy but not many green, leafy, vegetables.   No weight bearing exercises.   She does not take high vitamin A doses.  Menopause was at 75 y/o.   Pt does have a FH of osteoporosis: sister. She had RA.   No h/o kidney stones. She has a history of secondary hyperparathyroidism due to her chronic kidney disease, however, the latest PTH reviewed was normal: 10/06/2015 PTH 38.  Lab Results  Component Value Date   CALCIUM 9.1 04/18/2016   CALCIUM 8.9 08/07/2015   CALCIUM 8.8 07/12/2015   CALCIUM 9.3 08/10/2013   CALCIUM 9.4 03/15/2013   CALCIUM 9.6 03/02/2013   CALCIUM 9.4 02/01/2013   CALCIUM 9.2 03/06/2011   Patient has a history of hypothyroidism. Reviewed TSH recent levels: Last TSH was high, after which her levothyroxine dose was increased to 75 g daily. Lab Results  Component Value Date   TSH 6.58 (H) 04/18/2016   TSH 0.924 08/07/2015   TSH 0.15 (L) 07/12/2015   TSH 0.62 05/16/2015   TSH 0.60 11/14/2014   Last BUN/Cr: Lab Results  Component  Value Date   BUN 20 04/18/2016   CREATININE 2.53 (H) 04/18/2016   Recent alkaline phosphatase levels have been normal.  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  ROS: Constitutional: + weight loss (4 pounds), no fatigue, no subjective hyperthermia/hypothermia Eyes: no blurry vision, no xerophthalmia ENT: no sore throat, no nodules palpated in throat, no dysphagia/odynophagia, no hoarseness Cardiovascular: no CP/SOB/palpitations/leg swelling Respiratory: no cough/SOB Gastrointestinal: no N/V/D/C Musculoskeletal: no muscle/joint aches Skin: no rashes, + hair loss Neurological: no tremors/numbness/tingling/dizziness Psychiatric: no depression/+ anxiety  Past Medical History:  Diagnosis Date  . Anxiety   . Asthma   . Biliary cirrhosis ([place])   . Celiac disease   . Dermatitis herpetiformis   . Diverticulitis   . Hyperthyroidism   . Hypotension   . Kidney disease   . Pancreas disorder   . Renal insufficiency    Past Surgical History:  Procedure Laterality Date  . [place]   80% removed  . THYROIDECTOMY  1965   Social History   Social History  . Marital status: Legally Separated    Spouse name: N/A   Occupational History  . retired Retired   Social History Main Topics  . Smoking status: Former Smoker    Types: Cigarettes    Quit date: 11/25/1981  . Smokeless tobacco: Never Used  . Alcohol use  No  . Drug use: No   Social History Narrative   Retired Engineer, manufacturing systems   5 sons- oldest son deceased due to injury   52 living sons all live locally   She lives alone   Completed the 10th grade   Legally separated   Enjoys gardening.   Current Outpatient Prescriptions on File Prior to Visit  Medication Sig Dispense Refill  . albuterol (PROAIR HFA) 108 (90 Base) MCG/ACT inhaler Inhale 2 puffs into the lungs every 6 (six) hours as needed. 3 Inhaler 2  . ALPRAZolam (XANAX) 0.5 MG tablet Take 1 tablet (0.5  mg total) by mouth 3 (three) times daily. 60 tablet 5  . atorvastatin (LIPITOR) 10 MG tablet Take 1 tablet (10 mg total) by mouth daily. 90 tablet 1  . Cholecalciferol ([facility] VITAMIN D) 2000 units CAPS Take 1 capsule (2,000 Units total) by mouth daily. 90 capsule 3  . esomeprazole (NEXIUM) 40 MG capsule TAKE 1 CAPSULE DAILY BEFORE BREAKFAST 90 capsule 1  . fexofenadine-pseudoephedrine (ALLEGRA-D) 60-120 MG per tablet One tablet twice daily as needed 60 tablet 6  . fluocinonide gel (LIDEX) 0.05 % APPLY TO AFFECTED AREA AS NEEDED 60 g 1  . ursodiol (ACTIGALL) 300 MG capsule Take 1 capsule (300 mg total) by mouth 3 (three) times daily. 270 capsule 1  . fluticasone (FLONASE) 50 MCG/ACT nasal spray Place 2 sprays into both nostrils daily. (Patient not taking: Reported on 07/08/2016) 16 g 11  . Multiple Vitamin (MULTIVITAMIN) tablet Take 1 tablet by mouth daily.       No current facility-administered medications on file prior to visit.    Allergies  Allergen Reactions  . Codeine     Asthma exacerbation  . Gluten Meal Rash   Family History  Problem Relation Age of Onset  . Emphysema Father     deceased  . Heart disease Mother     deceased  . Kidney disease Brother   . Colon cancer Neg Hx    PE: BP 122/62 (BP Location: Left Arm, Patient Position: Sitting)   Pulse 89   Ht 5\' 2"  (1.575 m)   Wt 99 lb (44.9 kg)   SpO2 99%   BMI 18.11 kg/m  Wt Readings from Last 3 Encounters:  07/08/16 99 lb (44.9 kg)  04/18/16 102 lb (46.3 kg)  02/29/16 102 lb 6.4 oz (46.4 kg)   Constitutional: Very thin,, in NAD. No kyphosis. Eyes: PERRLA, EOMI, no exophthalmos ENT: moist mucous membranes, no thyromegaly, no cervical lymphadenopathy Cardiovascular: RRR, No MRG Respiratory: CTA B Gastrointestinal: abdomen soft, NT, ND, BS+ Musculoskeletal: no deformities, strength intact in all 4, no spine tenderness at light percussion Skin: moist, warm, no rashes Neurological: no tremor with outstretched hands, DTR  normal in all 4  Assessment: 1. Low bone mineral density  Plan: 1. Low bone mineral density - Discussed about increased risk of fracture, depending on the T score, greatly increased when the T score is lower than -2.5, but it is actually a continuum and -2.5 should not be regarded as an absolute threshold. We reviewed her DEXA scan report together, and I explained that based on the T scores, she has an increased risk for fractures.  - I discussed with the patient that, in the setting of stage III to 4 chronic kidney disease, this can be due to one or more of the following: 1. Osteoporosis 2. Renal osteodystrophy >> with the possibility of adynamic bone disease 3. Osteomalacia 4. Hyperparathyroid bone disease - The  gold standard test to differentiate between the above if the tetracycline labeled bone biopsy, however this is not available to me. The best we can do is to try to categorize her bone disease by bone turnover, mineralization, and volume. So far,Her bone turnover does not appear to be elevated, based on the last PTH (which was suppressed under 100 >> less likely hyperparathyroid bone disease). Her bone mineralization is possibly normal in the setting of her recent normal vitamin D (>> less likely osteomalacia), however, renal osteodystrophy and especially adynamic bone disease can be a problem especially in the setting of a low parathyroid hormone and decreased GFR. In this case, treatment with antiresorptives is contraindicated. I advised the patient that we have 3 options:  1. Optimize her exercise, balance, vitamin D, and calcium and not treat her with anti-osteoporotic agents 2. Refer her to Dr. Gale Journey at [facility] for possible bone biopsy and further input 3. Treat her with teriparatide ([place]), however, this is off label in a patient with such a low GFR - She tells me she would like to avoid teriparatide if possible. I agree with her, especially since teriparatide is not as good on  impacting hip BMD, at least in the first year. We decided to start optimizing her bone status without medications for now pending lab results. When the results are back, I may decide to refer her to Dr. Gale Journey, or wait for the next DEXA scan (she tells me that she can have DEXA scans 1 year) to check for improvement.  - For now, we reviewed her dietary and supplemental calcium and vitamin D intake. I recommended to make sure she gets 1000-1200 mg of calcium daily but a recent vit D was normal on current supplementation >> will continue 2000 units vit D daily - given her specific instructions about food sources for Calcium and Vitamin D - see pt instructions  - discussed fall precautions   - given handout from [place] Re: weight bearing exercises - advised to do this every day or at least 5/7 days - we discussed about maintaining a good amount of protein in her diet. The recommended daily protein intake is ~0.8 g per kilogram per day (~45-50 mg for her)  I advised her to try to aim for this amount, since a diet low in proteins can exacerbate osteoporosis.  Also, avoid smoking or >2 drinks of alcohol a day. - We'll check the following labs (added a 24-hour urine calcium to see if she will need to supplement more calcium in her diet- especially in the setting of celiac disease): Orders Placed This Encounter  Procedures  . C-Telopeptide, Serum  . COMPLETE METABOLIC PANEL WITH GFR  . TSH  . Parathyroid hormone, intact (no Ca)  . Phosphorus  . Calcium, urine, 24 hour  . Creatinine, urine, 24 hour  - will see pt back in a year  - time spent with the patient: 1 hour, of which >50% was spent in obtaining information about her symptoms, reviewing her previous labs, evaluations, and treatments, counseling her about her condition (please see the discussed topics above), and developing a plan to further investigate it; she had a number of questions which I addressed. Component      Latest Ref Rng & Units 07/08/2016 07/10/2016  Sodium     135 - 146 mmol/L 134 (L)   Potassium     3.5 - 5.3 mmol/L 4.1   Chloride     98 - 110 mmol/L 107  CO2     20 - 31 mmol/L 17 (L)   Glucose     65 - 99 mg/dL 77   BUN     7 - 25 mg/dL 19   Creatinine     0.60 - 0.93 mg/dL 2.77 (H)   Total Bilirubin     0.2 - 1.2 mg/dL 0.3   Alkaline Phosphatase     33 - 130 U/L 72   AST     10 - 35 U/L 17   ALT     6 - 29 U/L 10   Total Protein     6.1 - 8.1 g/dL 6.7   Albumin     3.6 - 5.1 g/dL 3.9   Calcium     8.6 - 10.4 mg/dL 8.4 (L)   GFR, Est African American     >=60 mL/min 18 (L)   GFR, Est Non African American     >=60 mL/min 16 (L)   Calcium, Ur     Not estab mg/dL  2  Calcium, 24 hour urine     35 - 250 mg/24 h  68  Creatinine, Urine     20 - 320 mg/dL  23  Creatinine, 24H Ur     0.63 - 2.50 g/24 h  0.78  TSH     0.35 - 4.50 uIU/mL 0.94   C-Telopeptide, Serum     pg/mL 544   PTH     15 - 65 pg/mL 54   Phosphorus     2.3 - 4.6 mg/dL 3.4    Stage IV CKD. Normal PTH, TSH, phosphorus. Urine calcium level low.  Component     Latest Ref Rng & Units 07/08/2016  C-Telopeptide, Serum     pg/mL 544   CTX is not elevated.  Based on the results above, it is not clear whether the patient has adynamic bone disease or not. There is no sign of increased bone resorption: PTH is suppressed, a phosphorus is not elevated, CTX is not elevated. Therefore, I believe the patient will need a bone biopsy >> will refer to Dr. Edmonia James at [facility] for further investigation and possible treatment (? If teriparatide can be used in her case).  Philemon Kingdom, MD PhD [facility] Endocrinology

## 2016-07-09 ENCOUNTER — Encounter: Payer: Self-pay | Admitting: Internal Medicine

## 2016-07-09 ENCOUNTER — Telehealth: Payer: Self-pay | Admitting: Internal Medicine

## 2016-07-10 DIAGNOSIS — M8580 Other specified disorders of bone density and structure, unspecified site: Secondary | ICD-10-CM | POA: Diagnosis not present

## 2016-07-11 LAB — CREATININE, URINE, 24 HOUR
CREATININE, URINE: 23 mg/dL (ref 20–320)
Creatinine, 24H Ur: 0.78 g/(24.h) (ref 0.63–2.50)

## 2016-07-11 LAB — CALCIUM, URINE, 24 HOUR
CALCIUM 24HR UR: 68 mg/(24.h) (ref 35–250)
CALCIUM UR: 2 mg/dL

## 2016-07-11 LAB — PARATHYROID HORMONE, INTACT (NO CA): PTH: 54 pg/mL (ref 15–65)

## 2016-07-11 LAB — C-TELOPEPTIDE, SERUM: C-TELOPEPTIDE, SERUM: 544 pg/mL

## 2016-07-15 ENCOUNTER — Telehealth: Payer: Self-pay

## 2016-07-15 NOTE — Telephone Encounter (Signed)
Called patient and left message for patient to return call for lab results, and to discuss referral that was sent in. Left call back number.

## 2016-07-15 NOTE — Telephone Encounter (Signed)
Called and notified patient of lab results, and referral that was sent in. Patient asked if we could get the referral sent to someone in [place] as she does not drive long distance and would not go to [place]. Please advise. Thank you!

## 2016-08-23 DIAGNOSIS — F22 Delusional disorders: Secondary | ICD-10-CM | POA: Diagnosis not present

## 2016-08-23 DIAGNOSIS — N250 Renal osteodystrophy: Secondary | ICD-10-CM | POA: Diagnosis not present

## 2016-08-23 DIAGNOSIS — K743 Primary biliary cirrhosis: Secondary | ICD-10-CM | POA: Diagnosis not present

## 2016-08-23 DIAGNOSIS — D638 Anemia in other chronic diseases classified elsewhere: Secondary | ICD-10-CM | POA: Diagnosis not present

## 2016-08-23 DIAGNOSIS — Z23 Encounter for immunization: Secondary | ICD-10-CM | POA: Diagnosis not present

## 2016-08-23 DIAGNOSIS — N184 Chronic kidney disease, stage 4 (severe): Secondary | ICD-10-CM | POA: Diagnosis not present

## 2016-08-23 DIAGNOSIS — I959 Hypotension, unspecified: Secondary | ICD-10-CM | POA: Diagnosis not present

## 2016-08-23 DIAGNOSIS — K9041 Non-celiac gluten sensitivity: Secondary | ICD-10-CM | POA: Diagnosis not present

## 2016-08-23 DIAGNOSIS — N2581 Secondary hyperparathyroidism of renal origin: Secondary | ICD-10-CM | POA: Diagnosis not present

## 2016-08-23 DIAGNOSIS — N179 Acute kidney failure, unspecified: Secondary | ICD-10-CM | POA: Diagnosis not present

## 2016-08-23 LAB — CBC AND DIFFERENTIAL
HEMATOCRIT: 35 % — AB (ref 36–46)
Hemoglobin: 11.9 g/dL — AB (ref 12.0–16.0)
NEUTROS ABS: 4 /uL
PLATELETS: 222 10*3/uL (ref 150–399)
WBC: 5.9 10^3/mL

## 2016-08-23 LAB — HEPATIC FUNCTION PANEL
ALK PHOS: 90 U/L (ref 25–125)
ALT: 11 U/L (ref 7–35)
AST: 22 U/L (ref 13–35)
BILIRUBIN, TOTAL: 0.3 mg/dL

## 2016-08-23 LAB — BASIC METABOLIC PANEL
BUN: 21 mg/dL (ref 4–21)
Creatinine: 2.6 mg/dL — AB (ref 0.5–1.1)
GLUCOSE: 97 mg/dL
Potassium: 4.1 mmol/L (ref 3.4–5.3)
SODIUM: 133 mmol/L — AB (ref 137–147)

## 2016-08-28 ENCOUNTER — Ambulatory Visit: Payer: Medicare Other | Admitting: Internal Medicine

## 2016-08-28 ENCOUNTER — Encounter: Payer: Self-pay | Admitting: Internal Medicine

## 2016-08-28 LAB — URINALYSIS
Bilirubin, UA: NEGATIVE
KETONES, URINE: NEGATIVE
Nitrite, UA: NEGATIVE
Specific Gravity, UA: 1.005
Urobilinogen, UA: NORMAL
WBC UA: NONE SEEN
pH, UA: 7 (ref 4.5–8.0)

## 2016-08-28 LAB — PHOSPHORUS: PHOSPHORUS: 3.8

## 2016-09-13 ENCOUNTER — Other Ambulatory Visit: Payer: Self-pay | Admitting: Internal Medicine

## 2016-09-28 ENCOUNTER — Other Ambulatory Visit: Payer: Self-pay | Admitting: Internal Medicine

## 2016-10-15 ENCOUNTER — Telehealth: Payer: Self-pay

## 2016-10-15 ENCOUNTER — Other Ambulatory Visit: Payer: Self-pay | Admitting: Internal Medicine

## 2016-10-15 DIAGNOSIS — K219 Gastro-esophageal reflux disease without esophagitis: Secondary | ICD-10-CM

## 2016-10-15 MED ORDER — ESOMEPRAZOLE MAGNESIUM 40 MG PO CPDR: DELAYED_RELEASE_CAPSULE | ORAL | 3 refills | Status: DC

## 2016-10-15 NOTE — Telephone Encounter (Signed)
[facility] sent rf rq for Nexium capsule. This has not been filled since 05/16/2015.   Please advise

## 2016-11-11 DIAGNOSIS — N184 Chronic kidney disease, stage 4 (severe): Secondary | ICD-10-CM | POA: Diagnosis not present

## 2016-11-11 DIAGNOSIS — N2581 Secondary hyperparathyroidism of renal origin: Secondary | ICD-10-CM | POA: Diagnosis not present

## 2016-11-11 DIAGNOSIS — D638 Anemia in other chronic diseases classified elsewhere: Secondary | ICD-10-CM | POA: Diagnosis not present

## 2016-11-11 LAB — URINALYSIS
Bilirubin Urine: 0 mg/dL
Blood, UA: NEGATIVE
Glucose, Ur: 2
KETONES, URINE: NEGATIVE
NITRITE UA: NEGATIVE
Specific Gravity, UA: 1.02 (ref 1.005–1.030)
Urobilinogen, UA: NORMAL
WBC UA: NEGATIVE
pH, UA: 6.5 (ref 4.5–8.0)

## 2016-11-11 LAB — HEPATIC FUNCTION PANEL
ALT: 13 U/L (ref 7–35)
AST: 23 U/L (ref 13–35)
Alkaline Phosphatase: 116 U/L (ref 25–125)

## 2016-11-11 LAB — CBC AND DIFFERENTIAL
HCT: 38 % (ref 36–46)
Hemoglobin: 12.9 g/dL (ref 12.0–16.0)
NEUTROS ABS: 5 /uL
Platelets: 226 10*3/uL (ref 150–399)
WBC: 6.4 10*3/mL

## 2016-11-11 LAB — BASIC METABOLIC PANEL
BUN: 18 mg/dL (ref 4–21)
Creatinine: 2.8 mg/dL — AB (ref 0.5–1.1)
GLUCOSE: 166 mg/dL
Potassium: 3.9 mmol/L (ref 3.4–5.3)
SODIUM: 139 mmol/L (ref 137–147)

## 2016-11-13 DIAGNOSIS — D638 Anemia in other chronic diseases classified elsewhere: Secondary | ICD-10-CM | POA: Diagnosis not present

## 2016-11-13 DIAGNOSIS — I959 Hypotension, unspecified: Secondary | ICD-10-CM | POA: Diagnosis not present

## 2016-11-13 DIAGNOSIS — F22 Delusional disorders: Secondary | ICD-10-CM | POA: Diagnosis not present

## 2016-11-13 DIAGNOSIS — K743 Primary biliary cirrhosis: Secondary | ICD-10-CM | POA: Diagnosis not present

## 2016-11-13 DIAGNOSIS — N179 Acute kidney failure, unspecified: Secondary | ICD-10-CM | POA: Diagnosis not present

## 2016-11-13 DIAGNOSIS — N184 Chronic kidney disease, stage 4 (severe): Secondary | ICD-10-CM | POA: Diagnosis not present

## 2016-11-13 DIAGNOSIS — N250 Renal osteodystrophy: Secondary | ICD-10-CM | POA: Diagnosis not present

## 2016-11-13 DIAGNOSIS — N2581 Secondary hyperparathyroidism of renal origin: Secondary | ICD-10-CM | POA: Diagnosis not present

## 2016-11-13 DIAGNOSIS — K9041 Non-celiac gluten sensitivity: Secondary | ICD-10-CM | POA: Diagnosis not present

## 2016-11-14 ENCOUNTER — Encounter: Payer: Self-pay | Admitting: Internal Medicine

## 2016-11-14 LAB — IRON AND TIBC
IRON SATURATION: 18
IRON: 57
Iron Bind.Cap.(Total): 309
UIBC: 252

## 2016-11-21 ENCOUNTER — Telehealth: Payer: Self-pay | Admitting: *Deleted

## 2016-11-21 DIAGNOSIS — F411 Generalized anxiety disorder: Secondary | ICD-10-CM

## 2016-11-21 MED ORDER — ALPRAZOLAM 0.5 MG PO TABS: 0.5000 mg | ORAL_TABLET | Freq: Three times a day (TID) | ORAL | 0 refills | Status: DC

## 2016-11-21 NOTE — Telephone Encounter (Signed)
[place] controlled substance database checked.  Ok to fill medication.  

## 2016-11-21 NOTE — Telephone Encounter (Signed)
Rec'd call pt states she is needing refill on her alprazolam 0.5 mg. MD out of office pls advise...Johny Chess

## 2016-11-21 NOTE — Telephone Encounter (Signed)
Notified pt [facility].../lmb

## 2016-12-18 ENCOUNTER — Other Ambulatory Visit: Payer: Self-pay | Admitting: Internal Medicine

## 2016-12-19 ENCOUNTER — Other Ambulatory Visit: Payer: Self-pay | Admitting: Internal Medicine

## 2016-12-19 ENCOUNTER — Telehealth: Payer: Self-pay | Admitting: Internal Medicine

## 2016-12-19 DIAGNOSIS — F411 Generalized anxiety disorder: Secondary | ICD-10-CM

## 2016-12-19 MED ORDER — ALPRAZOLAM 0.5 MG PO TABS: 0.5000 mg | ORAL_TABLET | Freq: Three times a day (TID) | ORAL | 5 refills | Status: DC

## 2016-12-19 NOTE — Telephone Encounter (Signed)
done

## 2016-12-19 NOTE — Telephone Encounter (Signed)
rx faxed to pharmacy

## 2016-12-19 NOTE — Telephone Encounter (Signed)
Pt called needing refill of Xanax, please advise, MS

## 2017-02-07 DIAGNOSIS — N2581 Secondary hyperparathyroidism of renal origin: Secondary | ICD-10-CM | POA: Diagnosis not present

## 2017-02-07 DIAGNOSIS — N184 Chronic kidney disease, stage 4 (severe): Secondary | ICD-10-CM | POA: Diagnosis not present

## 2017-02-07 DIAGNOSIS — D638 Anemia in other chronic diseases classified elsewhere: Secondary | ICD-10-CM | POA: Diagnosis not present

## 2017-03-21 ENCOUNTER — Other Ambulatory Visit: Payer: Self-pay | Admitting: Internal Medicine

## 2017-03-31 ENCOUNTER — Telehealth: Payer: Self-pay

## 2017-03-31 NOTE — Telephone Encounter (Signed)
yes

## 2017-03-31 NOTE — Telephone Encounter (Signed)
Routing to dr Ronnald Ramp, patient's tscore is 3.3--are you ok with starting prolia injections, I will call patient to discuss, thanks

## 2017-03-31 NOTE — Telephone Encounter (Signed)
Routing to dr Ronnald Ramp, patient's tscore is 3.3---are you ok

## 2017-04-15 ENCOUNTER — Telehealth: Payer: Self-pay

## 2017-04-15 NOTE — Telephone Encounter (Signed)
Per dr Ronnald Ramp, patient should start prolia injections, insurance is being verified and tamara will call patient back with findings

## 2017-04-30 NOTE — Telephone Encounter (Signed)
Patient's insurance has been verified for prolia injections, estimated $370 copay (including $183 deductible still pending), if deductible is already met in last couple of weeks, estimated copay will be $200 copay will be due---patient is calling [employer] to see if she qualifies for assistance funds to pay copay and will call  back 

## 2017-05-05 DIAGNOSIS — D638 Anemia in other chronic diseases classified elsewhere: Secondary | ICD-10-CM | POA: Diagnosis not present

## 2017-05-05 DIAGNOSIS — N2581 Secondary hyperparathyroidism of renal origin: Secondary | ICD-10-CM | POA: Diagnosis not present

## 2017-05-05 DIAGNOSIS — N184 Chronic kidney disease, stage 4 (severe): Secondary | ICD-10-CM | POA: Diagnosis not present

## 2017-05-06 LAB — HEPATIC FUNCTION PANEL
ALT: 14 (ref 7–35)
AST: 22 (ref 13–35)
Alkaline Phosphatase: 117 (ref 25–125)
BILIRUBIN, TOTAL: 0.2

## 2017-05-06 LAB — BASIC METABOLIC PANEL
BUN: 22 — AB (ref 4–21)
CREATININE: 2.8 — AB (ref 0.5–1.1)
Glucose: 110
Potassium: 4.6 (ref 3.4–5.3)
Sodium: 138 (ref 137–147)

## 2017-05-06 LAB — CBC AND DIFFERENTIAL
HCT: 35 — AB (ref 36–46)
HEMOGLOBIN: 12 (ref 12.0–16.0)
Neutrophils Absolute: 4
PLATELETS: 253 (ref 150–399)
WBC: 6.2

## 2017-05-13 NOTE — Progress Notes (Addendum)
Subjective:   Shari Schmitt is a 76 y.o. female who presents for an Initial Medicare Annual Wellness Visit.  Review of Systems    No ROS.  Medicare Wellness Visit. Additional risk factors are reflected in the social history.   Cardiac Risk Factors include: none;dyslipidemia;advanced age (>2men, >13 women) Sleep patterns: feels rested on waking, gets up 1 times nightly to void and sleeps 6-7 hours nightly.   Home Safety/Smoke Alarms: Feels safe in home. Smoke alarms in place.  Living environment; residence and Firearm Safety: 1-story house/ trailer, no firearms. Lives alone, no need for DME, good family and friend support Seat Belt Safety/Bike Helmet: Wears seat belt.   Counseling:   Eye Exam- Last several years, patient states she will make an appointment for examination Dental- dentures  Female:   Pap- N/A     Mammo- Last 01/12/13,  BI-RADS CATEGORY 1: negative, referral placed today      Dexa scan- Last 05/16/16,  osteoporosis     CCS- Last 09/23/07, recall 10 years     Objective:    Today's Vitals   05/15/17 1317  BP: 120/64  Pulse: 76  Temp: 97.6 F (36.4 C)  TempSrc: Oral  SpO2: 98%  Weight: 113 lb 12 oz (51.6 kg)  Height: 5\' 2"  (1.575 m)   Body mass index is 20.81 kg/m.   Current Medications (verified) Outpatient Encounter Prescriptions as of 05/15/2017  Medication Sig  . albuterol (PROAIR HFA) 108 (90 Base) MCG/ACT inhaler Inhale 2 puffs into the lungs every 6 (six) hours as needed.  . ALPRAZolam (XANAX) 0.5 MG tablet Take 1 tablet (0.5 mg total) by mouth 3 (three) times daily.  Marland Kitchen atorvastatin (LIPITOR) 10 MG tablet Take 1 tablet (10 mg total) by mouth daily.  . Cholecalciferol ([facility] VITAMIN D) 2000 units CAPS Take 1 capsule (2,000 Units total) by mouth daily.  Marland Kitchen esomeprazole (NEXIUM) 40 MG capsule TAKE 1 CAPSULE DAILY BEFORE BREAKFAST  . fexofenadine-pseudoephedrine (ALLEGRA-D) 60-120 MG per tablet One tablet twice daily as needed  . fluocinonide gel  (LIDEX) 0.05 % APPLY TO AFFECTED AREA AS NEEDED  . fluticasone (FLONASE) 50 MCG/ACT nasal spray Place 2 sprays into both nostrils daily.  Marland Kitchen levothyroxine (SYNTHROID, LEVOTHROID) 75 MCG tablet TAKE 1 TABLET BY MOUTH  DAILY BEFORE BREAKFAST  . Multiple Vitamin (MULTIVITAMIN) tablet Take 1 tablet by mouth daily.    . ursodiol (ACTIGALL) 300 MG capsule TAKE 1 CAPSULE (300 MG TOTAL) BY MOUTH 3 (THREE) TIMES DAILY.   No facility-administered encounter medications on file as of 05/15/2017.     Allergies (verified) Codeine and Gluten meal   History: Past Medical History:  Diagnosis Date  . Anxiety   . Asthma   . Biliary cirrhosis ([place])   . Celiac disease   . Dermatitis herpetiformis   . Diverticulitis   . Hyperthyroidism   . Hypotension   . Kidney disease   . Pancreas disorder   . Renal insufficiency    Past Surgical History:  Procedure Laterality Date  . [place]   80% removed  . THYROIDECTOMY  1965   Family History  Problem Relation Age of Onset  . Emphysema Father        deceased  . Heart disease Mother        deceased  . Kidney disease Brother   . Colon cancer Neg Hx    Social History   Occupational History  . retired Retired   Social History Main Topics  .  Smoking status: Former Smoker    Types: Cigarettes    Quit date: 11/25/1981  . Smokeless tobacco: Never Used  . Alcohol use No  . Drug use: No  . Sexual activity: Not on file    Tobacco Counseling Counseling given: Not Answered   Activities of Daily Living In your present state of health, do you have any difficulty performing the following activities: 05/15/2017  Hearing? N  Vision? N  Difficulty concentrating or making decisions? N  Walking or climbing stairs? N  Dressing or bathing? N  Doing errands, shopping? N  Preparing Food and eating ? N  Using the Toilet? N  In the past six months, have you accidently leaked urine? N  Do you have problems with loss of bowel control? N  Managing  your Medications? N  Managing your Finances? N  Housekeeping or managing your Housekeeping? N  Some recent data might be hidden    Immunizations and Health Maintenance Immunization History  Administered Date(s) Administered  . Influenza Split 08/25/2012  . Influenza, High Dose Seasonal PF 08/23/2014  . Influenza,inj,Quad PF,36+ Mos 08/10/2013, 09/11/2015  . Influenza-Unspecified 08/23/2014, 08/23/2016  . Pneumococcal Conjugate-13 11/14/2014  . Pneumococcal Polysaccharide-23 07/12/2015  . Td 11/14/2014  . Zoster 07/12/2015   There are no preventive care reminders to display for this patient.  Patient Care Team: Janith Lima, MD as PCP - General (Internal Medicine)  Indicate any recent Medical Services you may have received from other than [facility] providers in the past year (date may be approximate).     Assessment:   This is a routine wellness examination for Shari Schmitt. Physical assessment deferred to PCP.  Hearing/Vision screen Hearing Screening Comments: Able to hear conversational tones w/o difficulty. No issues reported.  Passed whisper test   Dietary issues and exercise activities discussed: Current Exercise Habits: The patient has a physically strenous job, but has no regular exercise apart from work. (does extensive yard work, gardening), Intensity: Mild, Exercise limited by: None identified  Diet (meal preparation, eat out, water intake, caffeinated beverages, dairy products, fruits and vegetables): low fat/ cholesterol, low salt, patient has celiac disease which is controlled with diet, eats a variety of fruits and vegetables daily, limits salt, fat/cholesterol, sugar, caffeine, drinks 6-8 glasses of water daily.  Goals    . Continue to work with my flowers and in the garden      Depression Screen PHQ 2/9 Scores 05/15/2017 02/29/2016 11/14/2014  PHQ - 2 Score 0 0 0    Fall Risk Fall Risk  05/15/2017 02/29/2016 11/14/2014  Falls in the past year? No No No     Cognitive Function:       Ad8 score reviewed for issues:  Issues making decisions: no  Less interest in hobbies / activities: no  Repeats questions, stories (family complaining): no  Trouble using ordinary gadgets (microwave, computer, phone):no  Forgets the month or year: no  Mismanaging finances: no  Remembering appts: no  Daily problems with thinking and/or memory: no Ad8 score is= 0 Screening Tests Health Maintenance  Topic Date Due  . INFLUENZA VACCINE  06/25/2017  . TETANUS/TDAP  11/14/2024  . DEXA SCAN  Completed  . PNA vac Low Risk Adult  Completed      Plan:    Continue doing brain stimulating activities (puzzles, reading, adult coloring books, staying active) to keep memory sharp.   Continue to eat heart healthy diet (full of fruits, vegetables, whole grains, lean protein, water--limit salt, fat, and sugar intake)  and increase physical activity as tolerated.  I have personally reviewed and noted the following in the patient's chart:   . Medical and social history . Use of alcohol, tobacco or illicit drugs  . Current medications and supplements . Functional ability and status . Nutritional status . Physical activity . Advanced directives . List of other physicians . Vitals . Screenings to include cognitive, depression, and falls . Referrals and appointments  In addition, I have reviewed and discussed with patient certain preventive protocols, quality metrics, and best practice recommendations. A written personalized care plan for preventive services as well as general preventive health recommendations were provided to patient.     Michiel Cowboy, RN   05/15/2017   See AVS for instructions about healthy living and anticipatory guidance.

## 2017-05-13 NOTE — Progress Notes (Signed)
Pre visit review using our clinic review tool, if applicable. No additional management support is needed unless otherwise documented below in the visit note. 

## 2017-05-14 DIAGNOSIS — I959 Hypotension, unspecified: Secondary | ICD-10-CM | POA: Diagnosis not present

## 2017-05-14 DIAGNOSIS — N2581 Secondary hyperparathyroidism of renal origin: Secondary | ICD-10-CM | POA: Diagnosis not present

## 2017-05-14 DIAGNOSIS — N184 Chronic kidney disease, stage 4 (severe): Secondary | ICD-10-CM | POA: Diagnosis not present

## 2017-05-14 DIAGNOSIS — D631 Anemia in chronic kidney disease: Secondary | ICD-10-CM | POA: Diagnosis not present

## 2017-05-14 DIAGNOSIS — K743 Primary biliary cirrhosis: Secondary | ICD-10-CM | POA: Diagnosis not present

## 2017-05-15 ENCOUNTER — Encounter: Payer: Self-pay | Admitting: Internal Medicine

## 2017-05-15 ENCOUNTER — Ambulatory Visit (INDEPENDENT_AMBULATORY_CARE_PROVIDER_SITE_OTHER): Payer: Medicare Other | Admitting: Internal Medicine

## 2017-05-15 VITALS — BP 120/64 | HR 76 | Temp 97.6°F | Resp 16 | Ht 62.0 in | Wt 113.8 lb

## 2017-05-15 DIAGNOSIS — E559 Vitamin D deficiency, unspecified: Secondary | ICD-10-CM | POA: Diagnosis not present

## 2017-05-15 DIAGNOSIS — Z1239 Encounter for other screening for malignant neoplasm of breast: Secondary | ICD-10-CM

## 2017-05-15 DIAGNOSIS — N182 Chronic kidney disease, stage 2 (mild): Secondary | ICD-10-CM | POA: Diagnosis not present

## 2017-05-15 DIAGNOSIS — E7800 Pure hypercholesterolemia, unspecified: Secondary | ICD-10-CM | POA: Diagnosis not present

## 2017-05-15 DIAGNOSIS — Z0000 Encounter for general adult medical examination without abnormal findings: Secondary | ICD-10-CM | POA: Diagnosis not present

## 2017-05-15 DIAGNOSIS — E038 Other specified hypothyroidism: Secondary | ICD-10-CM | POA: Diagnosis not present

## 2017-05-15 NOTE — Patient Instructions (Addendum)
[facility]) Mammography service in [place], [place] Address: [address], [place], [place] [address] Hours: Open ? Closes 4:30PM Phone: [phone]  Continue doing brain stimulating activities (puzzles, reading, adult coloring books, staying active) to keep memory sharp.   Continue to eat heart healthy diet (full of fruits, vegetables, whole grains, lean protein, water--limit salt, fat, and sugar intake) and increase physical activity as tolerated.   Shari Schmitt , Thank you for taking time to come for your Medicare Wellness Visit. I appreciate your ongoing commitment to your health goals. Please review the following plan we discussed and let me know if I can assist you in the future.   These are the goals we discussed: Goals    . Continue to work with my flowers and in the garden       This is a list of the screening recommended for you and due dates:  Health Maintenance  Topic Date Due  . Flu Shot  06/25/2017  . Tetanus Vaccine  11/14/2024  . DEXA scan (bone density measurement)  Completed  . Pneumonia vaccines  Completed

## 2017-05-15 NOTE — Progress Notes (Signed)
Subjective:  Patient ID: Shari Schmitt, female    DOB: [birth year]  Age: 76 y.o. MRN: [id number]  CC: Medicare Wellness; Hypothyroidism; and Hyperlipidemia   HPI Shari Schmitt presents for f/up - She feels well today and offers no complaints. She tells me that she saw her nephrologist one day prior to this visit and is concerned about declining renal function.  Outpatient Medications Prior to Visit  Medication Sig Dispense Refill  . albuterol (PROAIR HFA) 108 (90 Base) MCG/ACT inhaler Inhale 2 puffs into the lungs every 6 (six) hours as needed. 3 Inhaler 2  . ALPRAZolam (XANAX) 0.5 MG tablet Take 1 tablet (0.5 mg total) by mouth 3 (three) times daily. 60 tablet 5  . atorvastatin (LIPITOR) 10 MG tablet Take 1 tablet (10 mg total) by mouth daily. 90 tablet 1  . Cholecalciferol ([facility] VITAMIN D) 2000 units CAPS Take 1 capsule (2,000 Units total) by mouth daily. 90 capsule 3  . esomeprazole (NEXIUM) 40 MG capsule TAKE 1 CAPSULE DAILY BEFORE BREAKFAST 90 capsule 3  . fexofenadine-pseudoephedrine (ALLEGRA-D) 60-120 MG per tablet One tablet twice daily as needed 60 tablet 6  . fluocinonide gel (LIDEX) 0.05 % APPLY TO AFFECTED AREA AS NEEDED 60 g 1  . fluticasone (FLONASE) 50 MCG/ACT nasal spray Place 2 sprays into both nostrils daily. 16 g 11  . levothyroxine (SYNTHROID, LEVOTHROID) 75 MCG tablet TAKE 1 TABLET BY MOUTH  DAILY BEFORE BREAKFAST 90 tablet 1  . Multiple Vitamin (MULTIVITAMIN) tablet Take 1 tablet by mouth daily.      . ursodiol (ACTIGALL) 300 MG capsule TAKE 1 CAPSULE (300 MG TOTAL) BY MOUTH 3 (THREE) TIMES DAILY. 270 capsule 1   No facility-administered medications prior to visit.     ROS Review of Systems  Constitutional: Negative.  Negative for appetite change, diaphoresis, fatigue and unexpected weight change.  HENT: Negative.   Eyes: Negative.  Negative for visual disturbance.  Respiratory: Negative.  Negative for cough, chest tightness, shortness of breath and wheezing.    Cardiovascular: Negative for chest pain, palpitations and leg swelling.  Gastrointestinal: Negative for abdominal pain, constipation, diarrhea, nausea and vomiting.  Endocrine: Negative.   Genitourinary: Negative.  Negative for decreased urine volume, difficulty urinating and urgency.  Musculoskeletal: Negative.  Negative for arthralgias and back pain.  Skin: Negative.  Negative for color change and rash.  Allergic/Immunologic: Negative.   Neurological: Negative.  Negative for dizziness, weakness and light-headedness.  Hematological: Negative for adenopathy. Does not bruise/bleed easily.  Psychiatric/Behavioral: Negative.     Objective:  BP 120/64 (BP Location: Left Arm, Patient Position: Sitting, Cuff Size: Normal)   Pulse 76   Temp 97.6 F (36.4 C) (Oral)   Ht 5\' 2"  (1.575 m)   Wt 113 lb 12 oz (51.6 kg)   SpO2 98%   BMI 20.81 kg/m   BP Readings from Last 3 Encounters:  05/15/17 120/64  07/08/16 122/62  04/18/16 138/78    Wt Readings from Last 3 Encounters:  05/15/17 113 lb 12 oz (51.6 kg)  07/08/16 99 lb (44.9 kg)  04/18/16 102 lb (46.3 kg)    Physical Exam  Constitutional: She is oriented to person, place, and time. No distress.  HENT:  Mouth/Throat: Oropharynx is clear and moist. No oropharyngeal exudate.  Eyes: Conjunctivae are normal. Right eye exhibits no discharge. Left eye exhibits no discharge. No scleral icterus.  Neck: Normal range of motion. Neck supple. No JVD present. No thyromegaly present.  Cardiovascular: Normal rate, regular rhythm and  intact distal pulses.  Exam reveals no gallop.   No murmur heard. Pulmonary/Chest: Effort normal and breath sounds normal. No respiratory distress. She has no wheezes. She has no rales. She exhibits no tenderness.  Abdominal: Soft. Bowel sounds are normal. She exhibits no distension and no mass. There is no tenderness. There is no rebound and no guarding.  Musculoskeletal: Normal range of motion. She exhibits no edema,  tenderness or deformity.  Lymphadenopathy:    She has no cervical adenopathy.  Neurological: She is alert and oriented to person, place, and time.  Skin: Skin is warm and dry. No rash noted. She is not diaphoretic. No erythema. No pallor.  Vitals reviewed.   Lab Results  Component Value Date   WBC 6.4 11/11/2016   HGB 12.9 11/11/2016   HCT 38 11/11/2016   PLT 226 11/11/2016   GLUCOSE 77 07/08/2016   CHOL 148 05/16/2015   TRIG 128.0 05/16/2015   HDL 52.30 05/16/2015   LDLCALC 70 05/16/2015   ALT 13 11/11/2016   AST 23 11/11/2016   NA 139 11/11/2016   K 3.9 11/11/2016   CL 107 07/08/2016   CREATININE 2.8 (A) 11/11/2016   BUN 18 11/11/2016   CO2 17 (L) 07/08/2016   TSH 0.94 07/08/2016   INR 1.0 03/06/2011    Dg Bone Density  Result Date: 05/16/2016  Date of study: 05/13/2016 Exam: DUAL X-RAY ABSORPTIOMETRY (DXA) FOR BONE MINERAL DENSITY (BMD) Instrument: [employer] [facility] Provider: PCP Indication: Follow-up for osteoporosis Comparison: none (please note that it is not possible to compare data from different instruments) Clinical data: Pt is a 76 y.o. female without h/o fractures. On calcium and vitamin D. Results:  Lumbar spine (L1-L4) Femoral neck (FN) T-score -1.1  RFN: -3.3 LFN: -3.1  Assessment: Patient has OSTEOPOROSIS according to the [facility] classification for osteoporosis (see below). Fracture risk: high Comments: the technical quality of the study is good Evaluation for secondary causes should be considered if clinically indicated. Recommend optimizing calcium (1200 mg/day) and vitamin D (800 IU/day). Followup: Repeat BMD is appropriate after 2 years or after 1-2 years if starting treatment. WHO criteria for diagnosis of osteoporosis in postmenopausal women and in men 67 y/o or older: - normal: T-score -1.0 to + 1.0 - osteopenia/low bone density: T-score between -2.5 and -1.0 - osteoporosis: T-score below -2.5 - severe osteoporosis: T-score below -2.5 with history of  fragility fracture Note: although not part of the WHO classification, the presence of a fragility fracture, regardless of the T-score, should be considered diagnostic of osteoporosis, provided other causes for the fracture have been excluded. Treatment: The National Osteoporosis Foundation recommends that treatment be considered in postmenopausal women and men age 39 or older with: 1. Hip or vertebral (clinical or morphometric) fracture 2. T-score of - 2.5 or lower at the spine or hip 3. 10-year fracture probability by FRAX of at least 20% for a major osteoporotic fracture and 3% for a hip fracture Philemon Kingdom, MD [facility]    Assessment & Plan:   Camarie was seen today for medicare wellness, hypothyroidism and hyperlipidemia.  Diagnoses and all orders for this visit:  Other specified hypothyroidism- I will recheck her TSH and will adjust her dose if indicated. -     Cancel: TSH; Future -     TSH; Future  Stage 2 chronic kidney disease -     Cancel: Comprehensive metabolic panel; Future -     Cancel: CBC with Differential/Platelet; Future  Pure hypercholesterolemia- she  is doing well on the statin, I will recheck her FLP to see if she has achieved her LDL goal. -     Cancel: Lipid panel; Future -     Lipid panel; Future  Vitamin D deficiency- will monitor her vitamin D level and have asked her to continue the vitamin D supplement. -     VITAMIN D 25 Hydroxy (Vit-D Deficiency, Fractures); Future  Screening breast examination -     MM Digital Screening; Future  Encounter for Medicare annual wellness exam   I am having Ms. Mazzeo maintain her multivitamin, fexofenadine-pseudoephedrine, fluticasone, albuterol, atorvastatin, fluocinonide gel, Cholecalciferol, ursodiol, esomeprazole, levothyroxine, and ALPRAZolam.  No orders of the defined types were placed in this encounter.    Follow-up: Return in about 4 months (around 09/14/2017).  Scarlette Calico, MD

## 2017-05-20 ENCOUNTER — Encounter: Payer: Self-pay | Admitting: Internal Medicine

## 2017-05-20 NOTE — Progress Notes (Unsigned)
Results entered and sent to scan  

## 2017-05-22 ENCOUNTER — Other Ambulatory Visit: Payer: Self-pay | Admitting: Internal Medicine

## 2017-06-04 DIAGNOSIS — Z1231 Encounter for screening mammogram for malignant neoplasm of breast: Secondary | ICD-10-CM | POA: Diagnosis not present

## 2017-06-04 LAB — HM MAMMOGRAPHY

## 2017-06-12 ENCOUNTER — Encounter: Payer: Self-pay | Admitting: Internal Medicine

## 2017-06-12 DIAGNOSIS — N6001 Solitary cyst of right breast: Secondary | ICD-10-CM | POA: Diagnosis not present

## 2017-06-13 ENCOUNTER — Encounter: Payer: Self-pay | Admitting: Internal Medicine

## 2017-06-13 NOTE — Progress Notes (Signed)
Abstracted and sent to scan  

## 2017-06-16 ENCOUNTER — Other Ambulatory Visit: Payer: Self-pay | Admitting: Internal Medicine

## 2017-06-16 DIAGNOSIS — K745 Biliary cirrhosis, unspecified: Secondary | ICD-10-CM

## 2017-06-16 MED ORDER — URSODIOL 300 MG PO CAPS: 300.0000 mg | ORAL_CAPSULE | Freq: Three times a day (TID) | ORAL | 1 refills | Status: DC

## 2017-06-23 ENCOUNTER — Other Ambulatory Visit: Payer: Self-pay

## 2017-07-08 ENCOUNTER — Ambulatory Visit: Payer: Medicare Other | Admitting: Internal Medicine

## 2017-07-18 ENCOUNTER — Other Ambulatory Visit: Payer: Self-pay | Admitting: Internal Medicine

## 2017-07-18 ENCOUNTER — Telehealth: Payer: Self-pay | Admitting: Internal Medicine

## 2017-07-18 DIAGNOSIS — F411 Generalized anxiety disorder: Secondary | ICD-10-CM

## 2017-07-18 MED ORDER — ALPRAZOLAM 0.5 MG PO TABS: 0.5000 mg | ORAL_TABLET | Freq: Three times a day (TID) | ORAL | 5 refills | Status: DC

## 2017-07-18 NOTE — Telephone Encounter (Signed)
RX written 

## 2017-07-18 NOTE — Telephone Encounter (Signed)
[facility].../lmb  

## 2017-07-18 NOTE — Telephone Encounter (Signed)
Pt called for a refill of her ALPRAZolam (XANAX) 0.5 MG tablet Had year fu on 05/15/2017 Please advise  [id number] [place] [place]

## 2017-07-18 NOTE — Telephone Encounter (Signed)
Check [place] registry last filled 06/09/2017 pls advise...Shari Schmitt

## 2017-07-20 ENCOUNTER — Other Ambulatory Visit: Payer: Self-pay | Admitting: Internal Medicine

## 2017-07-20 DIAGNOSIS — F411 Generalized anxiety disorder: Secondary | ICD-10-CM

## 2017-07-21 NOTE — Telephone Encounter (Signed)
[facility].../lmb  

## 2017-08-05 DIAGNOSIS — N184 Chronic kidney disease, stage 4 (severe): Secondary | ICD-10-CM | POA: Diagnosis not present

## 2017-08-05 DIAGNOSIS — N2581 Secondary hyperparathyroidism of renal origin: Secondary | ICD-10-CM | POA: Diagnosis not present

## 2017-08-05 DIAGNOSIS — D638 Anemia in other chronic diseases classified elsewhere: Secondary | ICD-10-CM | POA: Diagnosis not present

## 2017-08-21 ENCOUNTER — Telehealth: Payer: Self-pay | Admitting: Internal Medicine

## 2017-08-21 MED ORDER — LEVOTHYROXINE SODIUM 75 MCG PO TABS: 75.0000 ug | ORAL_TABLET | Freq: Every day | ORAL | 0 refills | Status: DC

## 2017-08-21 NOTE — Telephone Encounter (Signed)
Pt called for a refill of her levothyroxine (SYNTHROID, LEVOTHROID) 75 MCG tablet  [facility] Please advise

## 2017-08-21 NOTE — Telephone Encounter (Signed)
Sent 90 day script to mail service pt will need f/u appt w/labs for future refills...Johny Chess

## 2017-09-09 ENCOUNTER — Ambulatory Visit: Payer: Medicare Other | Admitting: Internal Medicine

## 2017-09-10 ENCOUNTER — Other Ambulatory Visit: Payer: Self-pay | Admitting: Internal Medicine

## 2017-09-30 DIAGNOSIS — Z23 Encounter for immunization: Secondary | ICD-10-CM | POA: Diagnosis not present

## 2017-10-09 ENCOUNTER — Other Ambulatory Visit: Payer: Self-pay | Admitting: Internal Medicine

## 2017-10-27 DIAGNOSIS — D638 Anemia in other chronic diseases classified elsewhere: Secondary | ICD-10-CM | POA: Diagnosis not present

## 2017-10-27 DIAGNOSIS — N184 Chronic kidney disease, stage 4 (severe): Secondary | ICD-10-CM | POA: Diagnosis not present

## 2017-10-27 DIAGNOSIS — N2581 Secondary hyperparathyroidism of renal origin: Secondary | ICD-10-CM | POA: Diagnosis not present

## 2017-11-14 ENCOUNTER — Telehealth: Payer: Self-pay

## 2017-11-14 ENCOUNTER — Other Ambulatory Visit: Payer: Self-pay | Admitting: Internal Medicine

## 2017-11-14 DIAGNOSIS — K219 Gastro-esophageal reflux disease without esophagitis: Secondary | ICD-10-CM

## 2017-11-14 DIAGNOSIS — K745 Biliary cirrhosis, unspecified: Secondary | ICD-10-CM

## 2017-11-14 NOTE — Telephone Encounter (Signed)
Copied from [place] [id number]. Topic: General - Other >> Nov 13, 2017 11:00 AM Carolyn Stare wrote:   Pt would to know what is the injection that you can get for osteoporosis . Would like a call back   [phone]  I have talked with patient and discussed all information concerning prolia---I have advised patient that she needs to schedule office visit with dr Ronnald Ramp to discuss osteoporosis tx---patient has made appt for 12/26

## 2017-11-14 NOTE — Telephone Encounter (Signed)
Pt was to follow up in Oct. Please have pt schedule an appt.

## 2017-11-14 NOTE — Telephone Encounter (Signed)
Pt is scheduled on Wednesday, 11/19/2017.

## 2017-11-19 ENCOUNTER — Ambulatory Visit (INDEPENDENT_AMBULATORY_CARE_PROVIDER_SITE_OTHER): Payer: Medicare Other | Admitting: Internal Medicine

## 2017-11-19 ENCOUNTER — Other Ambulatory Visit (INDEPENDENT_AMBULATORY_CARE_PROVIDER_SITE_OTHER): Payer: Medicare Other

## 2017-11-19 ENCOUNTER — Encounter: Payer: Self-pay | Admitting: Internal Medicine

## 2017-11-19 VITALS — BP 122/78 | HR 88 | Temp 97.6°F | Resp 16 | Ht 62.0 in | Wt 109.0 lb

## 2017-11-19 DIAGNOSIS — E559 Vitamin D deficiency, unspecified: Secondary | ICD-10-CM

## 2017-11-19 DIAGNOSIS — N183 Chronic kidney disease, stage 3 unspecified: Secondary | ICD-10-CM

## 2017-11-19 DIAGNOSIS — E039 Hypothyroidism, unspecified: Secondary | ICD-10-CM

## 2017-11-19 LAB — URINALYSIS, ROUTINE W REFLEX MICROSCOPIC
BILIRUBIN URINE: NEGATIVE
KETONES UR: NEGATIVE
LEUKOCYTES UA: NEGATIVE
Nitrite: NEGATIVE
Specific Gravity, Urine: <=1.005 — AB (ref 1.000–1.030)
UROBILINOGEN UA: 0.2 (ref 0.0–1.0)
Urine Glucose: 100 — AB
pH: 7 (ref 5.0–8.0)

## 2017-11-19 LAB — BASIC METABOLIC PANEL
BUN: 14 mg/dL (ref 6–23)
CALCIUM: 8.9 mg/dL (ref 8.4–10.5)
CO2: 19 meq/L (ref 19–32)
Chloride: 110 mEq/L (ref 96–112)
Creatinine, Ser: 2.73 mg/dL — ABNORMAL HIGH (ref 0.40–1.20)
GFR: 17.88 mL/min — ABNORMAL LOW (ref >60.00)
GLUCOSE: 89 mg/dL (ref 70–99)
POTASSIUM: 3.9 meq/L (ref 3.5–5.1)
SODIUM: 136 meq/L (ref 135–145)

## 2017-11-19 LAB — LIPID PANEL
CHOLESTEROL: 183 mg/dL (ref 0–200)
HDL: 51.6 mg/dL (ref >39.00)
LDL Cholesterol: 101 mg/dL — ABNORMAL HIGH (ref 0–99)
NonHDL: 131.02
TRIGLYCERIDES: 152 mg/dL — AB (ref 0.0–149.0)
Total CHOL/HDL Ratio: 4
VLDL: 30.4 mg/dL (ref 0.0–40.0)

## 2017-11-19 LAB — CBC WITH DIFFERENTIAL/PLATELET
Basophils Absolute: 0.1 10*3/uL (ref 0.0–0.1)
Basophils Relative: 1.1 % (ref 0.0–3.0)
EOS PCT: 3.1 % (ref 0.0–5.0)
Eosinophils Absolute: 0.3 10*3/uL (ref 0.0–0.7)
HCT: 40.3 % (ref 36.0–46.0)
Hemoglobin: 13 g/dL (ref 12.0–15.0)
LYMPHS ABS: 1.4 10*3/uL (ref 0.7–4.0)
Lymphocytes Relative: 15.9 % (ref 12.0–46.0)
MCHC: 32.3 g/dL (ref 30.0–36.0)
MCV: 88 fl (ref 78.0–100.0)
MONOS PCT: 8.1 % (ref 3.0–12.0)
Monocytes Absolute: 0.7 10*3/uL (ref 0.1–1.0)
NEUTROS ABS: 6.3 10*3/uL (ref 1.4–7.7)
NEUTROS PCT: 71.8 % (ref 43.0–77.0)
PLATELETS: 259 10*3/uL (ref 150.0–400.0)
RBC: 4.58 Mil/uL (ref 3.87–5.11)
RDW: 15 % (ref 11.5–15.5)
WBC: 8.8 10*3/uL (ref 4.0–10.5)

## 2017-11-19 LAB — TSH: TSH: 4.74 u[IU]/mL — ABNORMAL HIGH (ref 0.35–4.50)

## 2017-11-19 LAB — PHOSPHORUS: Phosphorus: 3.6 mg/dL (ref 2.3–4.6)

## 2017-11-19 MED ORDER — CHOLECALCIFEROL 50 MCG (2000 UT) PO CAPS: 1.0000 | ORAL_CAPSULE | Freq: Every day | ORAL | 3 refills | Status: DC

## 2017-11-19 NOTE — Progress Notes (Signed)
Subjective:  Patient ID: Shari Schmitt, female    DOB: 10/20/1941  Age: 76 y.o. MRN: [id number]  CC: Hyperlipidemia and Hypothyroidism   HPI MILLIANA REDDOCH presents for f/up -she feels well today and offers no complaints.  She feels like her thyroid dose is adequate as she has had no recent episodes of constipation, diarrhea, changes in her weight, fatigue, or edema.  Outpatient Medications Prior to Visit  Medication Sig Dispense Refill  . albuterol (PROAIR HFA) 108 (90 Base) MCG/ACT inhaler Inhale 2 puffs into the lungs every 6 (six) hours as needed. 3 Inhaler 2  . ALPRAZolam (XANAX) 0.5 MG tablet TAKE 1 TABLET BY MOUTH 3 TIMES A DAY 60 tablet 3  . atorvastatin (LIPITOR) 10 MG tablet Take 1 tablet (10 mg total) by mouth daily. 90 tablet 1  . esomeprazole (NEXIUM) 40 MG capsule TAKE 1 CAPSULE DAILY BEFORE BREAKFAST 90 capsule 3  . fluocinonide gel (LIDEX) 0.05 % APPLY TO AFFECTED AREA(S)  TWO TIMES DAILY AS NEEDED 60 g 1  . levothyroxine (SYNTHROID, LEVOTHROID) 75 MCG tablet TAKE 1 TABLET BY MOUTH  DAILY BEFORE BREAKFAST 90 tablet 0  . Multiple Vitamin (MULTIVITAMIN) tablet Take 1 tablet by mouth daily.      . ursodiol (ACTIGALL) 300 MG capsule Take 1 capsule (300 mg total) by mouth 3 (three) times daily. 270 capsule 1  . Cholecalciferol ([facility]S VITAMIN D) 2000 units CAPS Take 1 capsule (2,000 Units total) by mouth daily. 90 capsule 3  . fexofenadine-pseudoephedrine (ALLEGRA-D) 60-120 MG per tablet One tablet twice daily as needed 60 tablet 6  . fluticasone (FLONASE) 50 MCG/ACT nasal spray Place 2 sprays into both nostrils daily. 16 g 11   No facility-administered medications prior to visit.     ROS Review of Systems  Constitutional: Negative.  Negative for appetite change, diaphoresis, fatigue and unexpected weight change.  HENT: Negative.  Negative for trouble swallowing and voice change.   Eyes: Negative.   Respiratory: Negative.  Negative for cough, chest tightness, shortness of  breath and wheezing.   Cardiovascular: Negative for chest pain, palpitations and leg swelling.  Gastrointestinal: Negative for abdominal pain, constipation, diarrhea, nausea and vomiting.  Endocrine: Negative.  Negative for cold intolerance and heat intolerance.  Genitourinary: Negative.   Musculoskeletal: Negative.  Negative for arthralgias and myalgias.  Skin: Negative.   Neurological: Negative.  Negative for dizziness, weakness and headaches.  Hematological: Negative for adenopathy. Does not bruise/bleed easily.  Psychiatric/Behavioral: Negative.     Objective:  BP 122/78 (BP Location: Left Arm, Patient Position: Sitting, Cuff Size: Normal)   Pulse 88   Temp 97.6 F (36.4 C)   Resp 16   Ht 5\' 2"  (1.575 m)   Wt 109 lb (49.4 kg)   SpO2 99%   BMI 19.94 kg/m   BP Readings from Last 3 Encounters:  11/19/17 122/78  05/15/17 120/64  07/08/16 122/62    Wt Readings from Last 3 Encounters:  11/19/17 109 lb (49.4 kg)  05/15/17 113 lb 12 oz (51.6 kg)  07/08/16 99 lb (44.9 kg)    Physical Exam  Constitutional: She is oriented to person, place, and time. No distress.  HENT:  Mouth/Throat: Oropharynx is clear and moist. No oropharyngeal exudate.  Eyes: Conjunctivae are normal. Left eye exhibits no discharge. No scleral icterus.  Neck: Normal range of motion. Neck supple. No JVD present. No thyromegaly present.  Cardiovascular: Normal rate, regular rhythm and normal heart sounds.  No murmur heard. Pulmonary/Chest: Effort normal  and breath sounds normal. No respiratory distress. She has no wheezes. She has no rales.  Abdominal: Soft. Bowel sounds are normal. She exhibits no distension and no mass. There is no tenderness. There is no guarding.  Musculoskeletal: Normal range of motion. She exhibits no edema or tenderness.  Neurological: She is alert and oriented to person, place, and time.  Skin: Skin is warm and dry. No rash noted. She is not diaphoretic. No erythema. No pallor.    Vitals reviewed.   Lab Results  Component Value Date   WBC 8.8 11/19/2017   HGB 13.0 11/19/2017   HCT 40.3 11/19/2017   PLT 259.0 11/19/2017   GLUCOSE 89 11/19/2017   CHOL 183 11/19/2017   TRIG 152.0 (H) 11/19/2017   HDL 51.60 11/19/2017   LDLCALC 101 (H) 11/19/2017   ALT 14 05/06/2017   AST 22 05/06/2017   NA 136 11/19/2017   K 3.9 11/19/2017   CL 110 11/19/2017   CREATININE 2.73 (H) 11/19/2017   BUN 14 11/19/2017   CO2 19 11/19/2017   TSH 4.74 (H) 11/19/2017   INR 1.0 03/06/2011    Dg Bone Density  Result Date: 05/16/2016  Date of study: 05/13/2016 Exam: DUAL X-RAY ABSORPTIOMETRY (DXA) FOR BONE MINERAL DENSITY (BMD) Instrument: [employer] Provider: PCP Indication: Follow-up for osteoporosis Comparison: none (please note that it is not possible to compare data from different instruments) Clinical data: Pt is a 76 y.o. female without h/o fractures. On calcium and vitamin D. Results:  Lumbar spine (L1-L4) Femoral neck (FN) T-score -1.1  RFN: -3.3 LFN: -3.1  Assessment: Patient has OSTEOPOROSIS according to the [facility] classification for osteoporosis (see below). Fracture risk: high Comments: the technical quality of the study is good Evaluation for secondary causes should be considered if clinically indicated. Recommend optimizing calcium (1200 mg/day) and vitamin D (800 IU/day). Followup: Repeat BMD is appropriate after 2 years or after 1-2 years if starting treatment. WHO criteria for diagnosis of osteoporosis in postmenopausal women and in men 54 y/o or older: - normal: T-score -1.0 to + 1.0 - osteopenia/low bone density: T-score between -2.5 and -1.0 - osteoporosis: T-score below -2.5 - severe osteoporosis: T-score below -2.5 with history of fragility fracture Note: although not part of the WHO classification, the presence of a fragility fracture, regardless of the T-score, should be considered diagnostic of osteoporosis, provided other causes for the fracture have  been excluded. Treatment: The National Osteoporosis Foundation recommends that treatment be considered in postmenopausal women and men age 35 or older with: 1. Hip or vertebral (clinical or morphometric) fracture 2. T-score of - 2.5 or lower at the spine or hip 3. 10-year fracture probability by FRAX of at least 20% for a major osteoporotic fracture and 3% for a hip fracture Philemon Kingdom, MD [facility]    Assessment & Plan:   Adryana was seen today for hyperlipidemia and hypothyroidism.  Diagnoses and all orders for this visit:  Stage 3 chronic kidney disease ([place])- Her renal function is stable.  She continues to avoid nephrotoxic agents.  There is no indication for hemodialysis.  Her electrolytes are normal.  Will continue to monitor this with no intervention at this time. -     CBC with Differential/Platelet; Future -     Basic metabolic panel; Future -     Urinalysis, Routine w reflex microscopic; Future -     Phosphorus; Future  Vitamin D deficiency -     Cholecalciferol ([facility] VITAMIN D) 2000 units  CAPS; Take 1 capsule (2,000 Units total) by mouth daily.  Acquired hypothyroidism- Her TSH is in the normal range.  She will remain on the current dose of levothyroxine. -     TSH; Future -     Lipid panel; Future   I have discontinued Emmalee C. Wente's fexofenadine-pseudoephedrine and fluticasone. I am also having her maintain her multivitamin, albuterol, atorvastatin, esomeprazole, ursodiol, ALPRAZolam, fluocinonide gel, levothyroxine, and Cholecalciferol.  Meds ordered this encounter  Medications  . Cholecalciferol ([facility]S VITAMIN D) 2000 units CAPS    Sig: Take 1 capsule (2,000 Units total) by mouth daily.    Dispense:  90 capsule    Refill:  3     Follow-up: Return in about 6 months (around 05/20/2018).  Scarlette Calico, MD

## 2017-11-19 NOTE — Patient Instructions (Signed)
Hypothyroidism Hypothyroidism is a disorder of the thyroid. The thyroid is a large gland that is located in the lower front of the neck. The thyroid releases hormones that control how the body works. With hypothyroidism, the thyroid does not make enough of these hormones. What are the causes? Causes of hypothyroidism may include:  Viral infections.  Pregnancy.  Your own defense system (immune system) attacking your thyroid.  Certain medicines.  Birth defects.  Past radiation treatments to your head or neck.  Past treatment with radioactive iodine.  Past surgical removal of part or all of your thyroid.  Problems with the gland that is located in the center of your brain (pituitary).  What are the signs or symptoms? Signs and symptoms of hypothyroidism may include:  Feeling as though you have no energy (lethargy).  Inability to tolerate cold.  Weight gain that is not explained by a change in diet or exercise habits.  Dry skin.  Coarse hair.  Menstrual irregularity.  Slowing of thought processes.  Constipation.  Sadness or depression.  How is this diagnosed? Your health care provider may diagnose hypothyroidism with blood tests and ultrasound tests. How is this treated? Hypothyroidism is treated with medicine that replaces the hormones that your body does not make. After you begin treatment, it may take several weeks for symptoms to go away. Follow these instructions at home:  Take medicines only as directed by your health care provider.  If you start taking any new medicines, tell your health care provider.  Keep all follow-up visits as directed by your health care provider. This is important. As your condition improves, your dosage needs may change. You will need to have blood tests regularly so that your health care provider can watch your condition. Contact a health care provider if:  Your symptoms do not get better with treatment.  You are taking thyroid  replacement medicine and: ? You sweat excessively. ? You have tremors. ? You feel anxious. ? You lose weight rapidly. ? You cannot tolerate heat. ? You have emotional swings. ? You have diarrhea. ? You feel weak. Get help right away if:  You develop chest pain.  You develop an irregular heartbeat.  You develop a rapid heartbeat. This information is not intended to replace advice given to you by your health care provider. Make sure you discuss any questions you have with your health care provider. Document Released: 11/11/2005 Document Revised: 04/18/2016 Document Reviewed: 03/29/2014 Elsevier Interactive Patient Education  2018 Elsevier Inc[employer]  

## 2017-11-20 ENCOUNTER — Encounter: Payer: Self-pay | Admitting: Internal Medicine

## 2017-12-24 ENCOUNTER — Other Ambulatory Visit: Payer: Self-pay | Admitting: Internal Medicine

## 2017-12-24 DIAGNOSIS — K745 Biliary cirrhosis, unspecified: Secondary | ICD-10-CM

## 2018-01-13 ENCOUNTER — Other Ambulatory Visit: Payer: Self-pay | Admitting: Internal Medicine

## 2018-01-13 DIAGNOSIS — K219 Gastro-esophageal reflux disease without esophagitis: Secondary | ICD-10-CM

## 2018-01-13 MED ORDER — ESOMEPRAZOLE MAGNESIUM 40 MG PO CPDR: DELAYED_RELEASE_CAPSULE | ORAL | 1 refills | Status: DC

## 2018-01-26 DIAGNOSIS — N184 Chronic kidney disease, stage 4 (severe): Secondary | ICD-10-CM | POA: Diagnosis not present

## 2018-01-26 DIAGNOSIS — D631 Anemia in chronic kidney disease: Secondary | ICD-10-CM | POA: Diagnosis not present

## 2018-02-02 ENCOUNTER — Other Ambulatory Visit: Payer: Self-pay | Admitting: Internal Medicine

## 2018-02-02 DIAGNOSIS — F411 Generalized anxiety disorder: Secondary | ICD-10-CM

## 2018-02-02 NOTE — Telephone Encounter (Signed)
Copied from [place]. Topic: Quick Communication - Rx Refill/Question >> Feb 02, 2018 12:08 PM Percell Belt A wrote: Medication:  ALPRAZolam Duanne Moron) 0.5 MG tablet [[id number]]   Has the patient contacted their pharmacy? No    (Agent: If no, request that the patient contact the pharmacy for the refill.)   [facility]erred [facility] (with phone [facility] [address]    Agent: Please be advised that RX refills may take up to 3 business days. We ask that you follow-up with your pharmacy.

## 2018-02-02 NOTE — Telephone Encounter (Signed)
Refill of xanax  LOV 11/19/17  Dr. Ronnald Ramp  [facility] [address]  [place]  [phone]

## 2018-02-03 ENCOUNTER — Other Ambulatory Visit: Payer: Self-pay | Admitting: Internal Medicine

## 2018-02-03 DIAGNOSIS — F411 Generalized anxiety disorder: Secondary | ICD-10-CM

## 2018-02-03 MED ORDER — ALPRAZOLAM 0.5 MG PO TABS: 0.5000 mg | ORAL_TABLET | Freq: Three times a day (TID) | ORAL | 3 refills | Status: DC

## 2018-02-03 NOTE — Telephone Encounter (Signed)
Check [place] registry last filled 01/06/2018.Marland KitchenJohny Schmitt

## 2018-02-11 ENCOUNTER — Other Ambulatory Visit: Payer: Self-pay | Admitting: Internal Medicine

## 2018-03-05 ENCOUNTER — Other Ambulatory Visit: Payer: Self-pay | Admitting: Internal Medicine

## 2018-03-05 ENCOUNTER — Ambulatory Visit: Payer: Medicare Other | Admitting: Internal Medicine

## 2018-03-05 DIAGNOSIS — E039 Hypothyroidism, unspecified: Secondary | ICD-10-CM

## 2018-03-05 MED ORDER — LEVOTHYROXINE SODIUM 75 MCG PO TABS: ORAL_TABLET | ORAL | 1 refills | Status: DC

## 2018-03-16 ENCOUNTER — Other Ambulatory Visit: Payer: Self-pay | Admitting: *Deleted

## 2018-03-16 ENCOUNTER — Telehealth: Payer: Self-pay | Admitting: Internal Medicine

## 2018-03-16 ENCOUNTER — Other Ambulatory Visit: Payer: Self-pay | Admitting: Internal Medicine

## 2018-03-16 DIAGNOSIS — Z1231 Encounter for screening mammogram for malignant neoplasm of breast: Secondary | ICD-10-CM

## 2018-03-16 DIAGNOSIS — K745 Biliary cirrhosis, unspecified: Secondary | ICD-10-CM

## 2018-03-16 MED ORDER — URSODIOL 300 MG PO CAPS: 300.0000 mg | ORAL_CAPSULE | Freq: Three times a day (TID) | ORAL | 1 refills | Status: DC

## 2018-03-16 NOTE — Telephone Encounter (Signed)
Rx refilled per protocol- LOV: 11/19/17

## 2018-03-16 NOTE — Telephone Encounter (Unsigned)
Copied from [place] [id number]. Topic: Quick Communication - See Telephone Encounter >> Mar 16, 2018  9:11 AM Hewitt Shorts wrote: CRM for notification. See Telephone encounter for: 03/16/18. Pt is needing a refill on actigall 300 mg and would like to have it called in to [facility] did not have the phone number   Best number [phone]

## 2018-04-13 ENCOUNTER — Other Ambulatory Visit: Payer: Self-pay

## 2018-04-13 ENCOUNTER — Encounter: Payer: Self-pay | Admitting: Urgent Care

## 2018-04-13 ENCOUNTER — Ambulatory Visit (INDEPENDENT_AMBULATORY_CARE_PROVIDER_SITE_OTHER): Payer: Medicare Other | Admitting: Urgent Care

## 2018-04-13 VITALS — BP 132/64 | HR 99 | Temp 97.7°F | Resp 18 | Ht 62.0 in | Wt 113.0 lb

## 2018-04-13 DIAGNOSIS — R05 Cough: Secondary | ICD-10-CM

## 2018-04-13 DIAGNOSIS — J3489 Other specified disorders of nose and nasal sinuses: Secondary | ICD-10-CM | POA: Diagnosis not present

## 2018-04-13 DIAGNOSIS — Z9109 Other allergy status, other than to drugs and biological substances: Secondary | ICD-10-CM

## 2018-04-13 DIAGNOSIS — J0100 Acute maxillary sinusitis, unspecified: Secondary | ICD-10-CM | POA: Diagnosis not present

## 2018-04-13 DIAGNOSIS — R059 Cough, unspecified: Secondary | ICD-10-CM

## 2018-04-13 MED ORDER — AMOXICILLIN 500 MG PO CAPS: 500.0000 mg | ORAL_CAPSULE | Freq: Three times a day (TID) | ORAL | 0 refills | Status: DC

## 2018-04-13 NOTE — Progress Notes (Signed)
    MRN: [id number] DOB: 01-08-1941  Subjective:   Shari Schmitt is a 77 y.o. female presenting for 1 week history of worsening sinus congestion, sinus pain, runny nose, subjective fever, chills, dry cough.  Denies ear pain, ear drainage, chest pain, shortness of breath, wheezing, nausea, vomiting, belly pain, rashes, dizziness, confusion.  Denies smoking cigarettes or drinking alcohol.  Denies family history of lung disorders.  She has a history of allergies but does not take Allegra consistently.  Shari Schmitt has a current medication list which includes the following prescription(s): albuterol, alprazolam, cholecalciferol, esomeprazole, fluocinonide gel, levothyroxine, multivitamin, and ursodiol. Also is allergic to codeine and gluten meal.  Shari Schmitt  has a past medical history of Anxiety, Asthma, Biliary cirrhosis ([place]), Celiac disease, Dermatitis herpetiformis, Diverticulitis, Hyperthyroidism, Hypotension, Kidney disease, Pancreas disorder, and Renal insufficiency. Also  has a past surgical history that includes Pancreas surgery (1962) and Thyroidectomy (1965). Her family history includes Emphysema in her father; Heart disease in her mother; Kidney disease in her brother.   Objective:   Vitals: BP 132/64   Pulse 99   Temp 97.7 F (36.5 C) (Oral)   Resp 18   Ht 5\' 2"  (1.575 m)   Wt 113 lb (51.3 kg)   SpO2 99%   BMI 20.67 kg/m   Physical Exam  Constitutional: She is oriented to person, place, and time. She appears well-developed and well-nourished.  HENT:  Bilateral maxillary sinus tenderness.  TMs clear and without erythema.  Throat with postnasal drainage but no exudates.  Eyes: Right eye exhibits no discharge. Left eye exhibits no discharge. No scleral icterus.  Neck: Normal range of motion. Neck supple.  Cardiovascular: Normal rate, regular rhythm and intact distal pulses. Exam reveals no gallop and no friction rub.  No murmur heard. Pulmonary/Chest: No respiratory distress. She  has no wheezes. She has no rales.  Lymphadenopathy:    She has no cervical adenopathy.  Neurological: She is alert and oriented to person, place, and time.  Skin: Skin is warm and dry.  Psychiatric: She has a normal mood and affect.   Assessment and Plan :   Acute non-recurrent maxillary sinusitis  Environmental allergies  Sinus pain  Cough  We will cover patient for sinusitis given her sinus pain with amoxicillin.  I suspect is secondary to uncontrolled allergies given that she is inconsistent with her Allegra.  Patient plans on restarting this.  She is to use supportive care otherwise.  Follow-up as needed.  Jaynee Eagles, PA-C [facility] [phone] 04/13/2018  1:32 PM

## 2018-04-13 NOTE — Patient Instructions (Addendum)
Hydrate well with at least 2 liters (1 gallon) of water daily. For sore throat try using a honey-based tea. Use 3 teaspoons of honey with juice squeezed from half lemon. Place shaved pieces of ginger into 1/2-1 cup of water and warm over stove top. Then mix the ingredients and repeat every 4 hours as needed. Make sure you take Allegra for your allergies through the end of June.     Sinusitis, Adult Sinusitis is soreness and inflammation of your sinuses. Sinuses are hollow spaces in the bones around your face. Your sinuses are located:  Around your eyes.  In the middle of your forehead.  Behind your nose.  In your cheekbones.  Your sinuses and nasal passages are lined with a stringy fluid (mucus). Mucus normally drains out of your sinuses. When your nasal tissues become inflamed or swollen, the mucus can become trapped or blocked so air cannot flow through your sinuses. This allows bacteria, viruses, and funguses to grow, which leads to infection. Sinusitis can develop quickly and last for 7?10 days (acute) or for more than 12 weeks (chronic). Sinusitis often develops after a cold. What are the causes? This condition is caused by anything that creates swelling in the sinuses or stops mucus from draining, including:  Allergies.  Asthma.  Bacterial or viral infection.  Abnormally shaped bones between the nasal passages.  Nasal growths that contain mucus (nasal polyps).  Narrow sinus openings.  Pollutants, such as chemicals or irritants in the air.  A foreign object stuck in the nose.  A fungal infection. This is rare.  What increases the risk? The following factors may make you more likely to develop this condition:  Having allergies or asthma.  Having had a recent cold or respiratory tract infection.  Having structural deformities or blockages in your nose or sinuses.  Having a weak immune system.  Doing a lot of swimming or diving.  Overusing nasal  sprays.  Smoking.  What are the signs or symptoms? The main symptoms of this condition are pain and a feeling of pressure around the affected sinuses. Other symptoms include:  Upper toothache.  Earache.  Headache.  Bad breath.  Decreased sense of smell and taste.  A cough that may get worse at night.  Fatigue.  Fever.  Thick drainage from your nose. The drainage is often green and it may contain pus (purulent).  Stuffy nose or congestion.  Postnasal drip. This is when extra mucus collects in the throat or back of the nose.  Swelling and warmth over the affected sinuses.  Sore throat.  Sensitivity to light.  How is this diagnosed? This condition is diagnosed based on symptoms, a medical history, and a physical exam. To find out if your condition is acute or chronic, your health care provider may:  Look in your nose for signs of nasal polyps.  Tap over the affected sinus to check for signs of infection.  View the inside of your sinuses using an imaging device that has a light attached (endoscope).  If your health care provider suspects that you have chronic sinusitis, you may also:  Be tested for allergies.  Have a sample of mucus taken from your nose (nasal culture) and checked for bacteria.  Have a mucus sample examined to see if your sinusitis is related to an allergy.  If your sinusitis does not respond to treatment and it lasts longer than 8 weeks, you may have an MRI or CT scan to check your sinuses. These scans  also help to determine how severe your infection is. In rare cases, a bone biopsy may be done to rule out more serious types of fungal sinus disease. How is this treated? Treatment for sinusitis depends on the cause and whether your condition is chronic or acute. If a virus is causing your sinusitis, your symptoms will go away on their own within 10 days. You may be given medicines to relieve your symptoms, including:  Topical nasal decongestants.  They shrink swollen nasal passages and let mucus drain from your sinuses.  Antihistamines. These drugs block inflammation that is triggered by allergies. This can help to ease swelling in your nose and sinuses.  Topical nasal corticosteroids. These are nasal sprays that ease inflammation and swelling in your nose and sinuses.  Nasal saline washes. These rinses can help to get rid of thick mucus in your nose.  If your condition is caused by bacteria, you will be given an antibiotic medicine. If your condition is caused by a fungus, you will be given an antifungal medicine. Surgery may be needed to correct underlying conditions, such as narrow nasal passages. Surgery may also be needed to remove polyps. Follow these instructions at home: Medicines  Take, use, or apply over-the-counter and prescription medicines only as told by your health care provider. These may include nasal sprays.  If you were prescribed an antibiotic medicine, take it as told by your health care provider. Do not stop taking the antibiotic even if you start to feel better. Hydrate and Humidify  Drink enough water to keep your urine clear or pale yellow. Staying hydrated will help to thin your mucus.  Use a cool mist humidifier to keep the humidity level in your home above 50%.  Inhale steam for 10-15 minutes, 3-4 times a day or as told by your health care provider. You can do this in the bathroom while a hot shower is running.  Limit your exposure to cool or dry air. Rest  Rest as much as possible.  Sleep with your head raised (elevated).  Make sure to get enough sleep each night. General instructions  Apply a warm, moist washcloth to your face 3-4 times a day or as told by your health care provider. This will help with discomfort.  Wash your hands often with soap and water to reduce your exposure to viruses and other germs. If soap and water are not available, use hand sanitizer.  Do not smoke. Avoid being  around people who are smoking (secondhand smoke).  Keep all follow-up visits as told by your health care provider. This is important. Contact a health care provider if:  You have a fever.  Your symptoms get worse.  Your symptoms do not improve within 10 days. Get help right away if:  You have a severe headache.  You have persistent vomiting.  You have pain or swelling around your face or eyes.  You have vision problems.  You develop confusion.  Your neck is stiff.  You have trouble breathing. This information is not intended to replace advice given to you by your health care provider. Make sure you discuss any questions you have with your health care provider. Document Released: 11/11/2005 Document Revised: 07/07/2016 Document Reviewed: 09/06/2015 Elsevier Interactive Patient Education  2018 [employer].     IF you received an x-ray today, you will receive an invoice from [facility]. Please contact [facility] at [phone] with questions or concerns regarding your invoice.   IF you received labwork today,  you will receive an invoice from [place]. Please contact [facility] at [phone] with questions or concerns regarding your invoice.   Our billing staff will not be able to assist you with questions regarding bills from these companies.  You will be contacted with the lab results as soon as they are available. The fastest way to get your results is to activate your My Chart account. Instructions are located on the last page of this paperwork. If you have not heard from [place] regarding the results in 2 weeks, please contact this office.

## 2018-04-27 DIAGNOSIS — N184 Chronic kidney disease, stage 4 (severe): Secondary | ICD-10-CM | POA: Diagnosis not present

## 2018-04-27 DIAGNOSIS — N189 Chronic kidney disease, unspecified: Secondary | ICD-10-CM | POA: Diagnosis not present

## 2018-05-17 ENCOUNTER — Other Ambulatory Visit: Payer: Self-pay | Admitting: Internal Medicine

## 2018-05-17 DIAGNOSIS — K219 Gastro-esophageal reflux disease without esophagitis: Secondary | ICD-10-CM

## 2018-05-17 DIAGNOSIS — K745 Biliary cirrhosis, unspecified: Secondary | ICD-10-CM

## 2018-05-18 ENCOUNTER — Ambulatory Visit: Payer: Medicare Other

## 2018-06-01 ENCOUNTER — Other Ambulatory Visit: Payer: Self-pay | Admitting: Internal Medicine

## 2018-06-01 DIAGNOSIS — F411 Generalized anxiety disorder: Secondary | ICD-10-CM

## 2018-06-15 ENCOUNTER — Telehealth: Payer: Self-pay | Admitting: Internal Medicine

## 2018-06-15 ENCOUNTER — Other Ambulatory Visit: Payer: Self-pay | Admitting: Internal Medicine

## 2018-06-15 DIAGNOSIS — F411 Generalized anxiety disorder: Secondary | ICD-10-CM

## 2018-06-15 MED ORDER — ALPRAZOLAM 0.5 MG PO TABS: 0.5000 mg | ORAL_TABLET | Freq: Three times a day (TID) | ORAL | 0 refills | Status: DC

## 2018-06-15 NOTE — Telephone Encounter (Signed)
Copied from [place] [id number]. Topic: Quick Communication - Rx Refill/Question >> Jun 15, 2018 10:28 AM Oliver Pila B wrote: Medication: ALPRAZolam Duanne Moron) 0.5 MG tablet [[id number]]   Pt has an appt scheduled in the first open slot on 8.12.19, appt has been put on the cancellation list. If possible the pt would like to see if she can have a temporary supply b/c she is completely out of meds as of now, contact to advise

## 2018-06-15 NOTE — Telephone Encounter (Signed)
Pt has an appt scheduled for 07/06/2018. rq 30 day refill of alprazolam. Please advise

## 2018-06-18 DIAGNOSIS — Z1213 Encounter for screening for malignant neoplasm of small intestine: Secondary | ICD-10-CM | POA: Diagnosis not present

## 2018-06-18 DIAGNOSIS — Z803 Family history of malignant neoplasm of breast: Secondary | ICD-10-CM | POA: Diagnosis not present

## 2018-06-18 LAB — HM MAMMOGRAPHY

## 2018-06-25 ENCOUNTER — Encounter: Payer: Self-pay | Admitting: Internal Medicine

## 2018-07-06 ENCOUNTER — Ambulatory Visit (INDEPENDENT_AMBULATORY_CARE_PROVIDER_SITE_OTHER): Payer: Medicare Other | Admitting: Internal Medicine

## 2018-07-06 ENCOUNTER — Encounter

## 2018-07-06 ENCOUNTER — Other Ambulatory Visit (INDEPENDENT_AMBULATORY_CARE_PROVIDER_SITE_OTHER): Payer: Medicare Other

## 2018-07-06 ENCOUNTER — Encounter: Payer: Self-pay | Admitting: Internal Medicine

## 2018-07-06 VITALS — BP 140/80 | HR 92 | Temp 97.8°F | Ht 62.0 in | Wt 113.8 lb

## 2018-07-06 DIAGNOSIS — N182 Chronic kidney disease, stage 2 (mild): Secondary | ICD-10-CM

## 2018-07-06 DIAGNOSIS — E039 Hypothyroidism, unspecified: Secondary | ICD-10-CM

## 2018-07-06 LAB — URINALYSIS, ROUTINE W REFLEX MICROSCOPIC
Bilirubin Urine: NEGATIVE
Ketones, ur: NEGATIVE
Leukocytes, UA: NEGATIVE
Nitrite: NEGATIVE
PH: 7 (ref 5.0–8.0)
RBC / HPF: NONE SEEN (ref 0–?)
SPECIFIC GRAVITY, URINE: 1.01 (ref 1.000–1.030)
Urine Glucose: 100 — AB
Urobilinogen, UA: 0.2 (ref 0.0–1.0)

## 2018-07-06 LAB — BASIC METABOLIC PANEL
BUN: 20 mg/dL (ref 6–23)
CO2: 22 meq/L (ref 19–32)
Calcium: 9.3 mg/dL (ref 8.4–10.5)
Chloride: 108 mEq/L (ref 96–112)
Creatinine, Ser: 2.59 mg/dL — ABNORMAL HIGH (ref 0.40–1.20)
GFR: 18.97 mL/min — ABNORMAL LOW (ref >60.00)
GLUCOSE: 74 mg/dL (ref 70–99)
POTASSIUM: 4.4 meq/L (ref 3.5–5.1)
Sodium: 134 mEq/L — ABNORMAL LOW (ref 135–145)

## 2018-07-06 LAB — CBC WITH DIFFERENTIAL/PLATELET
BASOS ABS: 0.1 10*3/uL (ref 0.0–0.1)
BASOS PCT: 1.3 % (ref 0.0–3.0)
Eosinophils Absolute: 0.2 10*3/uL (ref 0.0–0.7)
Eosinophils Relative: 3.4 % (ref 0.0–5.0)
HEMATOCRIT: 37.8 % (ref 36.0–46.0)
Hemoglobin: 12.5 g/dL (ref 12.0–15.0)
Lymphocytes Relative: 18.3 % (ref 12.0–46.0)
Lymphs Abs: 1.2 10*3/uL (ref 0.7–4.0)
MCHC: 33 g/dL (ref 30.0–36.0)
MCV: 86.2 fl (ref 78.0–100.0)
MONOS PCT: 9.2 % (ref 3.0–12.0)
Monocytes Absolute: 0.6 10*3/uL (ref 0.1–1.0)
NEUTROS ABS: 4.4 10*3/uL (ref 1.4–7.7)
Neutrophils Relative %: 67.8 % (ref 43.0–77.0)
PLATELETS: 252 10*3/uL (ref 150.0–400.0)
RBC: 4.39 Mil/uL (ref 3.87–5.11)
RDW: 14.5 % (ref 11.5–15.5)
WBC: 6.6 10*3/uL (ref 4.0–10.5)

## 2018-07-06 LAB — TSH: TSH: 1.63 u[IU]/mL (ref 0.35–4.50)

## 2018-07-06 NOTE — Progress Notes (Signed)
Subjective:  Patient ID: Shari Schmitt, female    DOB: 1941-11-08  Age: 77 y.o. MRN: [id number]  CC: Hypothyroidism   HPI Shari Schmitt presents for f/up - She complains of intermittent constipation but gets adequate symptom relief with the occasional dose of MiraLAX.  She reports a good energy level and has had no episodes of fatigue or lower extremity edema.  She is followed annually by nephrology for her chronic kidney disease.  Outpatient Medications Prior to Visit  Medication Sig Dispense Refill  . albuterol (PROAIR HFA) 108 (90 Base) MCG/ACT inhaler Inhale 2 puffs into the lungs every 6 (six) hours as needed. 3 Inhaler 2  . ALPRAZolam (XANAX) 0.5 MG tablet Take 1 tablet (0.5 mg total) by mouth 3 (three) times daily. 60 tablet 0  . Cholecalciferol ([facility] VITAMIN D) 2000 units CAPS Take 1 capsule (2,000 Units total) by mouth daily. 90 capsule 3  . esomeprazole (NEXIUM) 40 MG capsule TAKE 1 CAPSULE BY MOUTH  DAILY BEFORE BREAKFAST 90 capsule 1  . fluocinonide gel (LIDEX) 0.05 % APPLY TO AFFECTED AREA(S)  TWO TIMES DAILY AS NEEDED 60 g 3  . levothyroxine (SYNTHROID, LEVOTHROID) 75 MCG tablet TAKE 1 TABLET BY MOUTH  DAILY BEFORE BREAKFAST 90 tablet 1  . Multiple Vitamin (MULTIVITAMIN) tablet Take 1 tablet by mouth daily.      . ursodiol (ACTIGALL) 300 MG capsule TAKE 1 CAPSULE BY MOUTH 3  TIMES DAILY 270 capsule 1   No facility-administered medications prior to visit.     ROS Review of Systems  Constitutional: Negative for appetite change, diaphoresis, fatigue and unexpected weight change.  HENT: Negative.   Eyes: Negative for visual disturbance.  Respiratory: Negative for cough, chest tightness, shortness of breath and wheezing.   Cardiovascular: Negative for chest pain, palpitations and leg swelling.  Gastrointestinal: Positive for constipation. Negative for abdominal pain, diarrhea, nausea and vomiting.  Endocrine: Negative for cold intolerance and heat intolerance.    Genitourinary: Negative.  Negative for decreased urine volume, difficulty urinating, frequency and hematuria.  Musculoskeletal: Negative.  Negative for arthralgias, back pain, myalgias and neck pain.  Skin: Negative.  Negative for color change and pallor.  Neurological: Negative.  Negative for dizziness, weakness, light-headedness and headaches.  Hematological: Negative for adenopathy. Does not bruise/bleed easily.  Psychiatric/Behavioral: Negative.     Objective:  BP 140/80 (BP Location: Left Arm, Patient Position: Sitting, Cuff Size: Normal)   Pulse 92   Temp 97.8 F (36.6 C) (Oral)   Ht 5\' 2"  (1.575 m)   Wt 113 lb 12 oz (51.6 kg)   SpO2 98%   BMI 20.81 kg/m   BP Readings from Last 3 Encounters:  07/06/18 140/80  04/13/18 132/64  11/19/17 122/78    Wt Readings from Last 3 Encounters:  07/06/18 113 lb 12 oz (51.6 kg)  04/13/18 113 lb (51.3 kg)  11/19/17 109 lb (49.4 kg)    Physical Exam  Constitutional: She is oriented to person, place, and time. No distress.  HENT:  Mouth/Throat: Oropharynx is clear and moist. No oropharyngeal exudate.  Eyes: Conjunctivae are normal. No scleral icterus.  Neck: Normal range of motion. Neck supple. No JVD present. No thyromegaly present.  Cardiovascular: Normal rate, regular rhythm and normal heart sounds. Exam reveals no gallop and no friction rub.  No murmur heard. Pulmonary/Chest: Effort normal and breath sounds normal. No respiratory distress. She has no wheezes. She has no rhonchi. She has no rales.  Abdominal: Soft. Normal appearance and bowel  sounds are normal. She exhibits no mass. There is no hepatosplenomegaly. There is no tenderness.  Musculoskeletal: Normal range of motion. She exhibits no edema, tenderness or deformity.  Lymphadenopathy:    She has no cervical adenopathy.  Neurological: She is alert and oriented to person, place, and time.  Skin: Skin is warm and dry. She is not diaphoretic. No pallor.  Psychiatric: She  has a normal mood and affect. Her behavior is normal. Thought content normal.  Vitals reviewed.   Lab Results  Component Value Date   WBC 6.6 07/06/2018   HGB 12.5 07/06/2018   HCT 37.8 07/06/2018   PLT 252.0 07/06/2018   GLUCOSE 74 07/06/2018   CHOL 183 11/19/2017   TRIG 152.0 (H) 11/19/2017   HDL 51.60 11/19/2017   LDLCALC 101 (H) 11/19/2017   ALT 14 05/06/2017   AST 22 05/06/2017   NA 134 (L) 07/06/2018   K 4.4 07/06/2018   CL 108 07/06/2018   CREATININE 2.59 (H) 07/06/2018   BUN 20 07/06/2018   CO2 22 07/06/2018   TSH 1.63 07/06/2018   INR 1.0 03/06/2011    Dg Bone Density  Result Date: 05/16/2016  Date of study: 05/13/2016 Exam: DUAL X-RAY ABSORPTIOMETRY (DXA) FOR BONE MINERAL DENSITY (BMD) Instrument: [employer] [facility] Provider: PCP Indication: Follow-up for osteoporosis Comparison: none (please note that it is not possible to compare data from different instruments) Clinical data: Pt is a 77 y.o. female without h/o fractures. On calcium and vitamin D. Results:  Lumbar spine (L1-L4) Femoral neck (FN) T-score -1.1  RFN: -3.3 LFN: -3.1  Assessment: Patient has OSTEOPOROSIS according to the [facility] classification for osteoporosis (see below). Fracture risk: high Comments: the technical quality of the study is good Evaluation for secondary causes should be considered if clinically indicated. Recommend optimizing calcium (1200 mg/day) and vitamin D (800 IU/day). Followup: Repeat BMD is appropriate after 2 years or after 1-2 years if starting treatment. WHO criteria for diagnosis of osteoporosis in postmenopausal women and in men 60 y/o or older: - normal: T-score -1.0 to + 1.0 - osteopenia/low bone density: T-score between -2.5 and -1.0 - osteoporosis: T-score below -2.5 - severe osteoporosis: T-score below -2.5 with history of fragility fracture Note: although not part of the WHO classification, the presence of a fragility fracture, regardless of the T-score, should be  considered diagnostic of osteoporosis, provided other causes for the fracture have been excluded. Treatment: The National Osteoporosis Foundation recommends that treatment be considered in postmenopausal women and men age 18 or older with: 1. Hip or vertebral (clinical or morphometric) fracture 2. T-score of - 2.5 or lower at the spine or hip 3. 10-year fracture probability by FRAX of at least 20% for a major osteoporotic fracture and 3% for a hip fracture Philemon Kingdom, MD [facility]    Assessment & Plan:   Shari Schmitt was seen today for hypothyroidism.  Diagnoses and all orders for this visit:  Acquired hypothyroidism- She appears euthyroid and her TSH is in the normal range.  She will remain on the current dose of levothyroxine. -     TSH; Future  Stage 2 chronic kidney disease- Her renal function is stable.  Her blood pressure is adequately well controlled.  She will continue to avoid nephrotoxic agents. -     Basic metabolic panel; Future -     CBC with Differential/Platelet; Future -     Urinalysis, Routine w reflex microscopic; Future   I am having Shari Schmitt maintain her  multivitamin, albuterol, Cholecalciferol, levothyroxine, fluocinonide gel, ursodiol, esomeprazole, and ALPRAZolam.  No orders of the defined types were placed in this encounter.    Follow-up: Return in about 6 months (around 01/06/2019).  Shari Calico, MD

## 2018-07-06 NOTE — Patient Instructions (Signed)
Hypothyroidism Hypothyroidism is a disorder of the thyroid. The thyroid is a large gland that is located in the lower front of the neck. The thyroid releases hormones that control how the body works. With hypothyroidism, the thyroid does not make enough of these hormones. What are the causes? Causes of hypothyroidism may include:  Viral infections.  Pregnancy.  Your own defense system (immune system) attacking your thyroid.  Certain medicines.  Birth defects.  Past radiation treatments to your head or neck.  Past treatment with radioactive iodine.  Past surgical removal of part or all of your thyroid.  Problems with the gland that is located in the center of your brain (pituitary).  What are the signs or symptoms? Signs and symptoms of hypothyroidism may include:  Feeling as though you have no energy (lethargy).  Inability to tolerate cold.  Weight gain that is not explained by a change in diet or exercise habits.  Dry skin.  Coarse hair.  Menstrual irregularity.  Slowing of thought processes.  Constipation.  Sadness or depression.  How is this diagnosed? Your health care provider may diagnose hypothyroidism with blood tests and ultrasound tests. How is this treated? Hypothyroidism is treated with medicine that replaces the hormones that your body does not make. After you begin treatment, it may take several weeks for symptoms to go away. Follow these instructions at home:  Take medicines only as directed by your health care provider.  If you start taking any new medicines, tell your health care provider.  Keep all follow-up visits as directed by your health care provider. This is important. As your condition improves, your dosage needs may change. You will need to have blood tests regularly so that your health care provider can watch your condition. Contact a health care provider if:  Your symptoms do not get better with treatment.  You are taking thyroid  replacement medicine and: ? You sweat excessively. ? You have tremors. ? You feel anxious. ? You lose weight rapidly. ? You cannot tolerate heat. ? You have emotional swings. ? You have diarrhea. ? You feel weak. Get help right away if:  You develop chest pain.  You develop an irregular heartbeat.  You develop a rapid heartbeat. This information is not intended to replace advice given to you by your health care provider. Make sure you discuss any questions you have with your health care provider. Document Released: 11/11/2005 Document Revised: 04/18/2016 Document Reviewed: 03/29/2014 Elsevier Interactive Patient Education  2018 Elsevier Inc[employer]  

## 2018-09-21 ENCOUNTER — Other Ambulatory Visit: Payer: Self-pay | Admitting: Internal Medicine

## 2018-09-21 DIAGNOSIS — E559 Vitamin D deficiency, unspecified: Secondary | ICD-10-CM

## 2018-09-21 DIAGNOSIS — Z23 Encounter for immunization: Secondary | ICD-10-CM | POA: Diagnosis not present

## 2018-10-28 ENCOUNTER — Other Ambulatory Visit: Payer: Self-pay | Admitting: Internal Medicine

## 2018-10-28 DIAGNOSIS — F411 Generalized anxiety disorder: Secondary | ICD-10-CM

## 2018-10-30 NOTE — Telephone Encounter (Signed)
Pt states her Shari Schmitt has notified her that this medication is not available in the 0.5 tablets. Please advise.

## 2018-10-30 NOTE — Telephone Encounter (Signed)
Contact pharmacy and they have the 1 mg tablet available. Are you okay with the 1 mg split in half or would you like to send to a different pharmacy?  Please advise

## 2018-11-02 NOTE — Telephone Encounter (Signed)
Spoke with pharmacy to give approval.

## 2018-11-02 NOTE — Telephone Encounter (Signed)
Yes, she can split it in half

## 2018-11-05 ENCOUNTER — Other Ambulatory Visit: Payer: Self-pay | Admitting: Internal Medicine

## 2018-11-05 MED ORDER — FLUOCINONIDE 0.05 % EX GEL: CUTANEOUS | 3 refills | Status: DC

## 2018-11-05 NOTE — Telephone Encounter (Signed)
Pls advise if ok to refill../lmb 

## 2018-11-05 NOTE — Telephone Encounter (Signed)
Copied from [place] [id number]. Topic: Quick Communication - Rx Refill/Question >> Nov 05, 2018  8:48 AM Reyne Dumas L wrote: Medication: fluocinonide gel (LIDEX) 0.05 %  Has the patient contacted their pharmacy? Yes - states no response (Agent: If no, request that the patient contact the pharmacy for the refill.) (Agent: If yes, when and what did the pharmacy advise?)  Preferred Pharmacy (with phone number or street name): [id number] - [place], [place] - [address] [phone] (Phone) [phone] (Fax)  Agent: Please be advised that RX refills may take up to 3 business days. We ask that you follow-up with your pharmacy.

## 2018-12-11 ENCOUNTER — Other Ambulatory Visit: Payer: Self-pay | Admitting: Internal Medicine

## 2018-12-11 DIAGNOSIS — K745 Biliary cirrhosis, unspecified: Secondary | ICD-10-CM

## 2018-12-11 DIAGNOSIS — E039 Hypothyroidism, unspecified: Secondary | ICD-10-CM

## 2018-12-11 DIAGNOSIS — K219 Gastro-esophageal reflux disease without esophagitis: Secondary | ICD-10-CM

## 2018-12-31 ENCOUNTER — Telehealth: Payer: Self-pay | Admitting: Internal Medicine

## 2018-12-31 NOTE — Telephone Encounter (Signed)
Insurance has been submitted and verified for Prolia. Patient is responsible for a $453 copay (Prolia - $255 + Deductible - $198). Due anytime. Tried to call patient but unable to leave a message.  Okay to schedule... Visit Note: Prolia ($453 copay - okay to give per Gareth Eagle) Visit Type: Nurse Provider: Nurse

## 2019-02-09 DIAGNOSIS — N184 Chronic kidney disease, stage 4 (severe): Secondary | ICD-10-CM | POA: Diagnosis not present

## 2019-02-09 DIAGNOSIS — N189 Chronic kidney disease, unspecified: Secondary | ICD-10-CM | POA: Diagnosis not present

## 2019-02-17 ENCOUNTER — Encounter: Payer: Self-pay | Admitting: Internal Medicine

## 2019-05-14 DIAGNOSIS — N184 Chronic kidney disease, stage 4 (severe): Secondary | ICD-10-CM | POA: Diagnosis not present

## 2019-05-30 ENCOUNTER — Other Ambulatory Visit: Payer: Self-pay | Admitting: Internal Medicine

## 2019-05-30 DIAGNOSIS — K745 Biliary cirrhosis, unspecified: Secondary | ICD-10-CM

## 2019-05-30 DIAGNOSIS — K219 Gastro-esophageal reflux disease without esophagitis: Secondary | ICD-10-CM

## 2019-05-30 DIAGNOSIS — E039 Hypothyroidism, unspecified: Secondary | ICD-10-CM

## 2019-06-24 ENCOUNTER — Other Ambulatory Visit: Payer: Self-pay

## 2019-07-14 ENCOUNTER — Other Ambulatory Visit: Payer: Self-pay | Admitting: Internal Medicine

## 2019-07-14 DIAGNOSIS — E039 Hypothyroidism, unspecified: Secondary | ICD-10-CM

## 2019-07-14 DIAGNOSIS — K219 Gastro-esophageal reflux disease without esophagitis: Secondary | ICD-10-CM

## 2019-07-14 DIAGNOSIS — K745 Biliary cirrhosis, unspecified: Secondary | ICD-10-CM

## 2019-08-16 DIAGNOSIS — N184 Chronic kidney disease, stage 4 (severe): Secondary | ICD-10-CM | POA: Diagnosis not present

## 2019-08-18 DIAGNOSIS — N184 Chronic kidney disease, stage 4 (severe): Secondary | ICD-10-CM | POA: Diagnosis not present

## 2019-09-01 ENCOUNTER — Other Ambulatory Visit: Payer: Self-pay | Admitting: Internal Medicine

## 2019-09-01 DIAGNOSIS — E039 Hypothyroidism, unspecified: Secondary | ICD-10-CM

## 2019-10-26 ENCOUNTER — Ambulatory Visit: Payer: Medicare Other | Admitting: Internal Medicine

## 2019-11-04 ENCOUNTER — Encounter: Payer: Self-pay | Admitting: Internal Medicine

## 2019-11-04 ENCOUNTER — Ambulatory Visit (INDEPENDENT_AMBULATORY_CARE_PROVIDER_SITE_OTHER): Payer: Medicare Other | Admitting: Internal Medicine

## 2019-11-04 ENCOUNTER — Other Ambulatory Visit (INDEPENDENT_AMBULATORY_CARE_PROVIDER_SITE_OTHER): Payer: Medicare Other

## 2019-11-04 ENCOUNTER — Other Ambulatory Visit: Payer: Self-pay

## 2019-11-04 VITALS — BP 144/60 | HR 89 | Temp 98.1°F | Resp 16 | Ht 62.0 in | Wt 101.2 lb

## 2019-11-04 DIAGNOSIS — E559 Vitamin D deficiency, unspecified: Secondary | ICD-10-CM

## 2019-11-04 DIAGNOSIS — K745 Biliary cirrhosis, unspecified: Secondary | ICD-10-CM

## 2019-11-04 DIAGNOSIS — E7800 Pure hypercholesterolemia, unspecified: Secondary | ICD-10-CM

## 2019-11-04 DIAGNOSIS — Z23 Encounter for immunization: Secondary | ICD-10-CM | POA: Diagnosis not present

## 2019-11-04 DIAGNOSIS — E039 Hypothyroidism, unspecified: Secondary | ICD-10-CM | POA: Diagnosis not present

## 2019-11-04 DIAGNOSIS — N1832 Chronic kidney disease, stage 3b: Secondary | ICD-10-CM | POA: Diagnosis not present

## 2019-11-04 DIAGNOSIS — L130 Dermatitis herpetiformis: Secondary | ICD-10-CM

## 2019-11-04 DIAGNOSIS — F5104 Psychophysiologic insomnia: Secondary | ICD-10-CM | POA: Insufficient documentation

## 2019-11-04 DIAGNOSIS — K219 Gastro-esophageal reflux disease without esophagitis: Secondary | ICD-10-CM

## 2019-11-04 LAB — CBC WITH DIFFERENTIAL/PLATELET
Basophils Absolute: 0 10*3/uL (ref 0.0–0.1)
Basophils Relative: 0.4 % (ref 0.0–3.0)
Eosinophils Absolute: 0.1 10*3/uL (ref 0.0–0.7)
Eosinophils Relative: 2.2 % (ref 0.0–5.0)
HCT: 40.9 % (ref 36.0–46.0)
Hemoglobin: 13.3 g/dL (ref 12.0–15.0)
Lymphocytes Relative: 17.5 % (ref 12.0–46.0)
Lymphs Abs: 1.2 10*3/uL (ref 0.7–4.0)
MCHC: 32.5 g/dL (ref 30.0–36.0)
MCV: 90.5 fl (ref 78.0–100.0)
Monocytes Absolute: 0.5 10*3/uL (ref 0.1–1.0)
Monocytes Relative: 7.8 % (ref 3.0–12.0)
Neutro Abs: 4.7 10*3/uL (ref 1.4–7.7)
Neutrophils Relative %: 72.1 % (ref 43.0–77.0)
Platelets: 237 10*3/uL (ref 150.0–400.0)
RBC: 4.53 Mil/uL (ref 3.87–5.11)
RDW: 15.4 % (ref 11.5–15.5)
WBC: 6.6 10*3/uL (ref 4.0–10.5)

## 2019-11-04 LAB — BASIC METABOLIC PANEL
BUN: 23 mg/dL (ref 6–23)
CO2: 17 mEq/L — ABNORMAL LOW (ref 19–32)
Calcium: 9 mg/dL (ref 8.4–10.5)
Chloride: 111 mEq/L (ref 96–112)
Creatinine, Ser: 2.89 mg/dL — ABNORMAL HIGH (ref 0.40–1.20)
GFR: 15.67 mL/min — ABNORMAL LOW (ref >60.00)
Glucose, Bld: 76 mg/dL (ref 70–99)
Potassium: 4.8 mEq/L (ref 3.5–5.1)
Sodium: 138 mEq/L (ref 135–145)

## 2019-11-04 LAB — LIPID PANEL
Cholesterol: 204 mg/dL — ABNORMAL HIGH (ref 0–200)
HDL: 82.2 mg/dL (ref >39.00)
LDL Cholesterol: 104 mg/dL — ABNORMAL HIGH (ref 0–99)
NonHDL: 121.81
Total CHOL/HDL Ratio: 2
Triglycerides: 87 mg/dL (ref 0.0–149.0)
VLDL: 17.4 mg/dL (ref 0.0–40.0)

## 2019-11-04 LAB — URINALYSIS, ROUTINE W REFLEX MICROSCOPIC
Bilirubin Urine: NEGATIVE
Hgb urine dipstick: NEGATIVE
Ketones, ur: NEGATIVE
Leukocytes,Ua: NEGATIVE
Nitrite: NEGATIVE
Specific Gravity, Urine: 1.02 (ref 1.000–1.030)
Total Protein, Urine: 100 — AB
Urine Glucose: 500 — AB
Urobilinogen, UA: 0.2 (ref 0.0–1.0)
pH: 6.5 (ref 5.0–8.0)

## 2019-11-04 LAB — HEPATIC FUNCTION PANEL
ALT: 9 U/L (ref 0–35)
AST: 17 U/L (ref 0–37)
Albumin: 4.1 g/dL (ref 3.5–5.2)
Alkaline Phosphatase: 104 U/L (ref 39–117)
Bilirubin, Direct: 0.1 mg/dL (ref 0.0–0.3)
Total Bilirubin: 0.4 mg/dL (ref 0.2–1.2)
Total Protein: 7.5 g/dL (ref 6.0–8.3)

## 2019-11-04 LAB — TSH: TSH: 0.15 u[IU]/mL — ABNORMAL LOW (ref 0.35–4.50)

## 2019-11-04 LAB — PROTIME-INR
INR: 1 ratio (ref 0.8–1.0)
Prothrombin Time: 11.6 s (ref 9.6–13.1)

## 2019-11-04 LAB — VITAMIN D 25 HYDROXY (VIT D DEFICIENCY, FRACTURES): VITD: 63.21 ng/mL (ref 30.00–100.00)

## 2019-11-04 MED ORDER — URSODIOL 300 MG PO CAPS: 300.0000 mg | ORAL_CAPSULE | Freq: Three times a day (TID) | ORAL | 1 refills | Status: DC

## 2019-11-04 MED ORDER — FLUOCINONIDE 0.05 % EX GEL: CUTANEOUS | 3 refills | Status: AC

## 2019-11-04 MED ORDER — LEVOTHYROXINE SODIUM 50 MCG PO TABS: 50.0000 ug | ORAL_TABLET | Freq: Every day | ORAL | 3 refills | Status: DC

## 2019-11-04 MED ORDER — CVS D3 50 MCG (2000 UT) PO CAPS: 2000.0000 [IU] | ORAL_CAPSULE | Freq: Every day | ORAL | 1 refills | Status: DC

## 2019-11-04 MED ORDER — BELSOMRA 10 MG PO TABS: 1.0000 | ORAL_TABLET | Freq: Every evening | ORAL | 5 refills | Status: DC | PRN

## 2019-11-04 MED ORDER — ESOMEPRAZOLE MAGNESIUM 40 MG PO CPDR: DELAYED_RELEASE_CAPSULE | ORAL | 1 refills | Status: DC

## 2019-11-04 NOTE — Progress Notes (Signed)
Subjective:  Patient ID: Shari Schmitt, female    DOB: 04-Dec-1940  Age: 78 y.o. MRN: [id number]  CC: Hypothyroidism  This visit occurred during the SARS-CoV-2 public health emergency.  Safety protocols were in place, including screening questions prior to the visit, additional usage of staff PPE, and extensive cleaning of exam room while observing appropriate contact time as indicated for disinfecting solutions.   HPI Shari Schmitt presents for f/up - She complains of weight loss and insomnia with frequent awakenings.  She tells me the insomnia causes next-day fatigue and irritability.  Outpatient Medications Prior to Visit  Medication Sig Dispense Refill   albuterol (PROAIR HFA) 108 (90 Base) MCG/ACT inhaler Inhale 2 puffs into the lungs every 6 (six) hours as needed. 3 Inhaler 2   Multiple Vitamin (MULTIVITAMIN) tablet Take 1 tablet by mouth daily.       ALPRAZolam (XANAX) 0.5 MG tablet TAKE 1 TABLET (0.5 MG TOTAL) BY MOUTH 3 (THREE) TIMES DAILY. 60 tablet 3   [facility] D3 2000 units CAPS TAKE 1 CAPSULE BY MOUTH EVERY DAY 90 capsule 3   esomeprazole (NEXIUM) 40 MG capsule TAKE 1 CAPSULE BY MOUTH  EVERY MORNING BEFORE  BREAKFAST 90 capsule 1   fluocinonide gel (LIDEX) 0.05 % APPLY TO AFFECTED AREA(S)  TOPICALLY TWO TIMES DAILY  AS NEEDED 60 g 3   levothyroxine (SYNTHROID) 75 MCG tablet TAKE 1 TABLET BY MOUTH  EVERY MORNING BEFORE  BREAKFAST 90 tablet 0   ursodiol (ACTIGALL) 300 MG capsule TAKE 1 CAPSULE BY MOUTH 3  TIMES DAILY 270 capsule 1   No facility-administered medications prior to visit.    ROS Review of Systems  Constitutional: Positive for fatigue and unexpected weight change (wt loss). Negative for appetite change, chills, diaphoresis and fever.  HENT: Negative.  Negative for sore throat and trouble swallowing.   Eyes: Negative for visual disturbance.  Respiratory: Negative for cough, chest tightness, shortness of breath and wheezing.   Cardiovascular: Negative for  chest pain, palpitations and leg swelling.  Gastrointestinal: Negative for abdominal pain, constipation, diarrhea, nausea and vomiting.  Endocrine: Negative for cold intolerance and heat intolerance.  Genitourinary: Negative.  Negative for difficulty urinating.  Musculoskeletal: Negative for arthralgias and myalgias.  Skin: Negative.  Negative for color change and pallor.  Neurological: Negative for dizziness, weakness, light-headedness and headaches.  Hematological: Negative for adenopathy. Does not bruise/bleed easily.  Psychiatric/Behavioral: Negative.     Objective:  BP (!) 144/60 (BP Location: Left Arm, Patient Position: Sitting, Cuff Size: Normal)    Pulse 89    Temp 98.1 F (36.7 C) (Oral)    Resp 16    Ht 5\' 2"  (1.575 m)    Wt 101 lb 4 oz (45.9 kg)    SpO2 99%    BMI 18.52 kg/m   BP Readings from Last 3 Encounters:  11/04/19 (!) 144/60  07/06/18 140/80  04/13/18 132/64    Wt Readings from Last 3 Encounters:  11/04/19 101 lb 4 oz (45.9 kg)  07/06/18 113 lb 12 oz (51.6 kg)  04/13/18 113 lb (51.3 kg)    Physical Exam Vitals reviewed.  Constitutional:      Appearance: Normal appearance.  HENT:     Nose: Nose normal.     Mouth/Throat:     Mouth: Mucous membranes are moist.  Eyes:     General: No scleral icterus.    Conjunctiva/sclera: Conjunctivae normal.  Cardiovascular:     Rate and Rhythm: Normal rate and regular rhythm.  Heart sounds: No murmur.  Pulmonary:     Effort: Pulmonary effort is normal.     Breath sounds: No stridor. No wheezing, rhonchi or rales.  Abdominal:     General: Abdomen is flat. Bowel sounds are normal. There is no distension.     Palpations: Abdomen is soft. There is no hepatomegaly or splenomegaly.     Tenderness: There is no abdominal tenderness.  Musculoskeletal:        General: Normal range of motion.     Cervical back: Neck supple.     Right lower leg: No edema.     Left lower leg: No edema.  Lymphadenopathy:     Cervical: No  cervical adenopathy.  Skin:    General: Skin is warm and dry.     Coloration: Skin is not pale.  Neurological:     General: No focal deficit present.     Mental Status: She is alert.  Psychiatric:        Mood and Affect: Mood normal.        Behavior: Behavior normal.     Lab Results  Component Value Date   WBC 6.6 11/04/2019   HGB 13.3 11/04/2019   HCT 40.9 11/04/2019   PLT 237.0 11/04/2019   GLUCOSE 76 11/04/2019   CHOL 204 (H) 11/04/2019   TRIG 87.0 11/04/2019   HDL 82.20 11/04/2019   LDLCALC 104 (H) 11/04/2019   ALT 9 11/04/2019   AST 17 11/04/2019   NA 138 11/04/2019   K 4.8 11/04/2019   CL 111 11/04/2019   CREATININE 2.89 (H) 11/04/2019   BUN 23 11/04/2019   CO2 17 (L) 11/04/2019   TSH 0.15 (L) 11/04/2019   INR 1.0 11/04/2019    DG Bone Density  Result Date: 05/16/2016  Date of study: 05/13/2016 Exam: DUAL X-RAY ABSORPTIOMETRY (DXA) FOR BONE MINERAL DENSITY (BMD) Instrument: [employer] Provider: PCP Indication: Follow-up for osteoporosis Comparison: none (please note that it is not possible to compare data from different instruments) Clinical data: Pt is a 78 y.o. female without h/o fractures. On calcium and vitamin D. Results:  Lumbar spine (L1-L4) Femoral neck (FN) T-score -1.1  RFN: -3.3 LFN: -3.1  Assessment: Patient has OSTEOPOROSIS according to the [facility] classification for osteoporosis (see below). Fracture risk: high Comments: the technical quality of the study is good Evaluation for secondary causes should be considered if clinically indicated. Recommend optimizing calcium (1200 mg/day) and vitamin D (800 IU/day). Followup: Repeat BMD is appropriate after 2 years or after 1-2 years if starting treatment. WHO criteria for diagnosis of osteoporosis in postmenopausal women and in men 33 y/o or older: - normal: T-score -1.0 to + 1.0 - osteopenia/low bone density: T-score between -2.5 and -1.0 - osteoporosis: T-score below -2.5 - severe osteoporosis:  T-score below -2.5 with history of fragility fracture Note: although not part of the WHO classification, the presence of a fragility fracture, regardless of the T-score, should be considered diagnostic of osteoporosis, provided other causes for the fracture have been excluded. Treatment: The National Osteoporosis Foundation recommends that treatment be considered in postmenopausal women and men age 99 or older with: 1. Hip or vertebral (clinical or morphometric) fracture 2. T-score of - 2.5 or lower at the spine or hip 3. 10-year fracture probability by FRAX of at least 20% for a major osteoporotic fracture and 3% for a hip fracture Philemon Kingdom, MD [facility]    Assessment & Plan:   Shari Schmitt was seen today for hypothyroidism.  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  Her liver enzymes are normal.  Will continue the current dose of ursodiol. -     Hepatic function panel; Future -     Protime-INR; Future -     ursodiol (ACTIGALL) 300 MG capsule; Take 1 capsule (300 mg total) by mouth 3 (three) times daily.  Acquired hypothyroidism- Her TSH is suppressed and she is symptomatic.  I recommended that she decrease her levothyroxine dose to 50 mcg once a day. -     TSH; Future -     levothyroxine (SYNTHROID) 50 MCG tablet; Take 1 tablet (50 mcg total) by mouth daily.  Stage 3b chronic kidney disease- Her renal function is stable.  I recommended she avoid nephrotoxic agents. -     CBC with Differential; Future -     Basic metabolic panel; Future -     Urinalysis, Routine w reflex microscopic; Future  Vitamin D deficiency -     Vitamin D 25 hydroxy; Future -     Cholecalciferol (CVS D3) 50 MCG (2000 UT) CAPS; Take 1 capsule (2,000 Units total) by mouth daily.  Pure hypercholesterolemia- Statin medication is not indicated. -     Lipid panel; Future  Need for influenza vaccination -     Flu Vaccine QUAD High  Dose(Fluad)  Psychophysiological insomnia -     Suvorexant (BELSOMRA) 10 MG TABS; Take 1 tablet by mouth at bedtime as needed.  Gastroesophageal reflux disease without esophagitis -     esomeprazole (NEXIUM) 40 MG capsule; TAKE 1 CAPSULE BY MOUTH  EVERY MORNING BEFORE  BREAKFAST  Dermatitis herpetiformis -     fluocinonide gel (LIDEX) 0.05 %; APPLY TO AFFECTED AREA(S)  TOPICALLY TWO TIMES DAILY  AS NEEDED   I have discontinued Jenell C. Achenbach's ALPRAZolam and levothyroxine. I have also changed her ursodiol and CVS D3. Additionally, I am having her start on Belsomra and levothyroxine. Lastly, I am having her maintain her multivitamin, albuterol, esomeprazole, and fluocinonide gel.  Meds ordered this encounter  Medications   Suvorexant (BELSOMRA) 10 MG TABS    Sig: Take 1 tablet by mouth at bedtime as needed.    Dispense:  30 tablet    Refill:  5   levothyroxine (SYNTHROID) 50 MCG tablet    Sig: Take 1 tablet (50 mcg total) by mouth daily.    Dispense:  90 tablet    Refill:  3   ursodiol (ACTIGALL) 300 MG capsule    Sig: Take 1 capsule (300 mg total) by mouth 3 (three) times daily.    Dispense:  270 capsule    Refill:  1    Requesting 1 year supply   esomeprazole (NEXIUM) 40 MG capsule    Sig: TAKE 1 CAPSULE BY MOUTH  EVERY MORNING BEFORE  BREAKFAST    Dispense:  90 capsule    Refill:  1    Requesting 1 year supply   Cholecalciferol (CVS D3) 50 MCG (2000 UT) CAPS    Sig: Take 1 capsule (2,000 Units total) by mouth daily.    Dispense:  90 capsule    Refill:  1   fluocinonide gel (LIDEX) 0.05 %    Sig: APPLY TO AFFECTED AREA(S)  TOPICALLY TWO TIMES DAILY  AS NEEDED    Dispense:  60 g    Refill:  3     Follow-up: Return in about 6 months (around 05/04/2020).  Marcello Moores  Ronnald Ramp, MD

## 2019-11-04 NOTE — Patient Instructions (Signed)
Hypothyroidism  Hypothyroidism is when the thyroid gland does not make enough of certain hormones (it is underactive). The thyroid gland is a small gland located in the lower front part of the neck, just in front of the windpipe (trachea). This gland makes hormones that help control how the body uses food for energy (metabolism) as well as how the heart and brain function. These hormones also play a role in keeping your bones strong. When the thyroid is underactive, it produces too little of the hormones thyroxine (T4) and triiodothyronine (T3). What are the causes? This condition may be caused by:  Hashimoto's disease. This is a disease in which the body's disease-fighting system (immune system) attacks the thyroid gland. This is the most common cause.  Viral infections.  Pregnancy.  Certain medicines.  Birth defects.  Past radiation treatments to the head or neck for cancer.  Past treatment with radioactive iodine.  Past exposure to radiation in the environment.  Past surgical removal of part or all of the thyroid.  Problems with a gland in the center of the brain (pituitary gland).  Lack of enough iodine in the diet. What increases the risk? You are more likely to develop this condition if:  You are female.  You have a family history of thyroid conditions.  You use a medicine called lithium.  You take medicines that affect the immune system (immunosuppressants). What are the signs or symptoms? Symptoms of this condition include:  Feeling as though you have no energy (lethargy).  Not being able to tolerate cold.  Weight gain that is not explained by a change in diet or exercise habits.  Lack of appetite.  Dry skin.  Coarse hair.  Menstrual irregularity.  Slowing of thought processes.  Constipation.  Sadness or depression. How is this diagnosed? This condition may be diagnosed based on:  Your symptoms, your medical history, and a physical exam.  Blood  tests. You may also have imaging tests, such as an ultrasound or MRI. How is this treated? This condition is treated with medicine that replaces the thyroid hormones that your body does not make. After you begin treatment, it may take several weeks for symptoms to go away. Follow these instructions at home:  Take over-the-counter and prescription medicines only as told by your health care provider.  If you start taking any new medicines, tell your health care provider.  Keep all follow-up visits as told by your health care provider. This is important. ? As your condition improves, your dosage of thyroid hormone medicine may change. ? You will need to have blood tests regularly so that your health care provider can monitor your condition. Contact a health care provider if:  Your symptoms do not get better with treatment.  You are taking thyroid replacement medicine and you: ? Sweat a lot. ? Have tremors. ? Feel anxious. ? Lose weight rapidly. ? Cannot tolerate heat. ? Have emotional swings. ? Have diarrhea. ? Feel weak. Get help right away if you have:  Chest pain.  An irregular heartbeat.  A rapid heartbeat.  Difficulty breathing. Summary  Hypothyroidism is when the thyroid gland does not make enough of certain hormones (it is underactive).  When the thyroid is underactive, it produces too little of the hormones thyroxine (T4) and triiodothyronine (T3).  The most common cause is Hashimoto's disease, a disease in which the body's disease-fighting system (immune system) attacks the thyroid gland. The condition can also be caused by viral infections, medicine, pregnancy, or past   radiation treatment to the head or neck.  Symptoms may include weight gain, dry skin, constipation, feeling as though you do not have energy, and not being able to tolerate cold.  This condition is treated with medicine to replace the thyroid hormones that your body does not make. This information  is not intended to replace advice given to you by your health care provider. Make sure you discuss any questions you have with your health care provider. Document Released: 11/11/2005 Document Revised: 10/24/2017 Document Reviewed: 10/22/2017 Elsevier Patient Education  2020 Elsevier Inc.  

## 2019-11-08 ENCOUNTER — Other Ambulatory Visit: Payer: Self-pay | Admitting: Internal Medicine

## 2019-11-08 DIAGNOSIS — E039 Hypothyroidism, unspecified: Secondary | ICD-10-CM

## 2019-11-08 NOTE — Telephone Encounter (Signed)
Medication Refill - Medication:levothyroxine (SYNTHROID) 50 MCG tablet    Has the patient contacted their pharmacy? No. (Agent: If no, request that the patient contact the pharmacy for the refill.) (Agent: If yes, when and what did the pharmacy advise?)  Preferred Pharm[facility] (with phone number [facility]  Pt got through mail order last time, only has 3 pills left, does not have time to wait for mail.  Pt is asking for refill to be sent to local pharm.    Agent: Please be advised that RX refills may take up to 3 business days. We ask that you follow-up with your pharmacy.

## 2019-11-30 ENCOUNTER — Other Ambulatory Visit: Payer: Self-pay | Admitting: Internal Medicine

## 2019-11-30 DIAGNOSIS — F5104 Psychophysiologic insomnia: Secondary | ICD-10-CM

## 2019-11-30 MED ORDER — BELSOMRA 10 MG PO TABS: 1.0000 | ORAL_TABLET | Freq: Every evening | ORAL | 5 refills | Status: DC | PRN

## 2019-11-30 NOTE — Telephone Encounter (Signed)
Requested medication (s) are due for refill today: yes  Requested medication (s) are on the active medication list: yes  Last refill:  11/04/2019  Future visit scheduled: no  Notes to clinic:  no assigned protocol    Requested Prescriptions  Pending Prescriptions Disp Refills   Suvorexant (BELSOMRA) 10 MG TABS 30 tablet 5    Sig: Take 1 tablet by mouth at bedtime as needed.      Off-Protocol Failed - 11/30/2019  8:23 AM      Failed - Medication not assigned to a protocol, review manually.      Passed - Valid encounter within last 12 months    Recent Outpatient Visits           3 weeks ago CIRRHOSIS, [place], Thomas L, MD   1 year ago Acquired hypothyroidism   [place], Thomas L, MD   1 year ago Acute non-recurrent maxillary sinusitis   [facility] [place], [place]   2 years ago Stage 3 chronic kidney disease [facility])   [facility] -Mayer Camel, MD   2 years ago Other specified hypothyroidism   [facility] -Mayer Camel, MD

## 2019-11-30 NOTE — Telephone Encounter (Signed)
Medication Refill - Medication: Suvorexant (BELSOMRA) 10 MG TABS   Has the patient contacted their pharmacy? Yes.   (Agent: If no, request that the patient contact the pharmacy for the refill.) (Agent: If yes, when and what did the pharmacy advise?)  Preferred Pharmacy (with phone number or street name):  [id number] [place] [place]  [address] [place] [address]  Phone: [phone] Fax: [phone]  Not a 24 hour pharmacy; exact hours not known.     Agent: Please be advised that RX refills may take up to 3 business days. We ask that you follow-up with your pharmacy.

## 2019-12-01 ENCOUNTER — Other Ambulatory Visit: Payer: Self-pay | Admitting: Internal Medicine

## 2019-12-01 DIAGNOSIS — E559 Vitamin D deficiency, unspecified: Secondary | ICD-10-CM

## 2019-12-07 ENCOUNTER — Telehealth: Payer: Self-pay | Admitting: Internal Medicine

## 2019-12-07 NOTE — Telephone Encounter (Signed)
Copied from [place] [id number]. Topic: General - Inquiry >> Dec 07, 2019 10:31 AM Alease Frame wrote: Reason for CRM: Shari Schmitt with [employer] calling regarding a fax that was sent over [phone]. Please advise  Call back number [phone]

## 2019-12-07 NOTE — Telephone Encounter (Signed)
Pt contacted and she did not want to have the Gene Panel test completed.   Called [employer] to inform of same.  Spoke to [place] and informed that PCP declined to sign form to have genetic testing done. Clifton James stated understanding.

## 2019-12-07 NOTE — Telephone Encounter (Signed)
I called [employer] back and they stated that they faxed a form for PCP to review. I will look for form.

## 2020-02-16 ENCOUNTER — Telehealth: Payer: Self-pay | Admitting: Internal Medicine

## 2020-02-16 DIAGNOSIS — K219 Gastro-esophageal reflux disease without esophagitis: Secondary | ICD-10-CM

## 2020-02-16 DIAGNOSIS — E039 Hypothyroidism, unspecified: Secondary | ICD-10-CM

## 2020-02-16 MED ORDER — LEVOTHYROXINE SODIUM 50 MCG PO TABS: 50.0000 ug | ORAL_TABLET | Freq: Every day | ORAL | 0 refills | Status: DC

## 2020-02-16 MED ORDER — ESOMEPRAZOLE MAGNESIUM 40 MG PO CPDR: DELAYED_RELEASE_CAPSULE | ORAL | 0 refills | Status: DC

## 2020-02-16 NOTE — Telephone Encounter (Signed)
[facility] for [facility].

## 2020-02-16 NOTE — Telephone Encounter (Signed)
    1.Medication Requested: esomeprazole (NEXIUM) 40 MG capsule levothyroxine (SYNTHROID) 50 MCG tablet   2. Pharmacy (Name, [place]):[id number] [place], [address]  3. On Med List: yes  4. Last Visit with PCP: 11/04/19  5. Next visit date with PCP: 05/08/20   Agent: Please be advised that RX refills may take up to 3 business days. We ask that you follow-up with your pharmacy.

## 2020-03-06 ENCOUNTER — Other Ambulatory Visit: Payer: Self-pay | Admitting: Internal Medicine

## 2020-03-06 DIAGNOSIS — K219 Gastro-esophageal reflux disease without esophagitis: Secondary | ICD-10-CM

## 2020-03-07 ENCOUNTER — Other Ambulatory Visit: Payer: Self-pay | Admitting: Internal Medicine

## 2020-03-07 DIAGNOSIS — E039 Hypothyroidism, unspecified: Secondary | ICD-10-CM

## 2020-05-08 ENCOUNTER — Ambulatory Visit: Payer: Medicare Other | Admitting: Internal Medicine

## 2020-05-09 ENCOUNTER — Ambulatory Visit: Payer: Medicare Other | Admitting: Internal Medicine

## 2020-05-11 ENCOUNTER — Ambulatory Visit (INDEPENDENT_AMBULATORY_CARE_PROVIDER_SITE_OTHER): Payer: Medicare Other | Admitting: Internal Medicine

## 2020-05-11 ENCOUNTER — Other Ambulatory Visit: Payer: Self-pay

## 2020-05-11 ENCOUNTER — Encounter: Payer: Self-pay | Admitting: Internal Medicine

## 2020-05-11 VITALS — BP 144/70 | HR 87 | Temp 98.1°F | Ht 62.0 in | Wt 98.4 lb

## 2020-05-11 DIAGNOSIS — N1832 Chronic kidney disease, stage 3b: Secondary | ICD-10-CM

## 2020-05-11 DIAGNOSIS — E039 Hypothyroidism, unspecified: Secondary | ICD-10-CM | POA: Diagnosis not present

## 2020-05-11 LAB — BASIC METABOLIC PANEL
BUN: 20 mg/dL (ref 6–23)
CO2: 18 mEq/L — ABNORMAL LOW (ref 19–32)
Calcium: 8.8 mg/dL (ref 8.4–10.5)
Chloride: 110 mEq/L (ref 96–112)
Creatinine, Ser: 2.99 mg/dL — ABNORMAL HIGH (ref 0.40–1.20)
GFR: 15.05 mL/min — ABNORMAL LOW (ref >60.00)
Glucose, Bld: 89 mg/dL (ref 70–99)
Potassium: 4.2 mEq/L (ref 3.5–5.1)
Sodium: 136 mEq/L (ref 135–145)

## 2020-05-11 LAB — TSH: TSH: 5.46 u[IU]/mL — ABNORMAL HIGH (ref 0.35–4.50)

## 2020-05-11 MED ORDER — LEVOTHYROXINE SODIUM 50 MCG PO TABS: 50.0000 ug | ORAL_TABLET | Freq: Every day | ORAL | 1 refills | Status: DC

## 2020-05-11 NOTE — Patient Instructions (Signed)
Hypothyroidism  Hypothyroidism is when the thyroid gland does not make enough of certain hormones (it is underactive). The thyroid gland is a small gland located in the lower front part of the neck, just in front of the windpipe (trachea). This gland makes hormones that help control how the body uses food for energy (metabolism) as well as how the heart and brain function. These hormones also play a role in keeping your bones strong. When the thyroid is underactive, it produces too little of the hormones thyroxine (T4) and triiodothyronine (T3). What are the causes? This condition may be caused by:  Hashimoto's disease. This is a disease in which the body's disease-fighting system (immune system) attacks the thyroid gland. This is the most common cause.  Viral infections.  Pregnancy.  Certain medicines.  Birth defects.  Past radiation treatments to the head or neck for cancer.  Past treatment with radioactive iodine.  Past exposure to radiation in the environment.  Past surgical removal of part or all of the thyroid.  Problems with a gland in the center of the brain (pituitary gland).  Lack of enough iodine in the diet. What increases the risk? You are more likely to develop this condition if:  You are female.  You have a family history of thyroid conditions.  You use a medicine called lithium.  You take medicines that affect the immune system (immunosuppressants). What are the signs or symptoms? Symptoms of this condition include:  Feeling as though you have no energy (lethargy).  Not being able to tolerate cold.  Weight gain that is not explained by a change in diet or exercise habits.  Lack of appetite.  Dry skin.  Coarse hair.  Menstrual irregularity.  Slowing of thought processes.  Constipation.  Sadness or depression. How is this diagnosed? This condition may be diagnosed based on:  Your symptoms, your medical history, and a physical exam.  Blood  tests. You may also have imaging tests, such as an ultrasound or MRI. How is this treated? This condition is treated with medicine that replaces the thyroid hormones that your body does not make. After you begin treatment, it may take several weeks for symptoms to go away. Follow these instructions at home:  Take over-the-counter and prescription medicines only as told by your health care provider.  If you start taking any new medicines, tell your health care provider.  Keep all follow-up visits as told by your health care provider. This is important. ? As your condition improves, your dosage of thyroid hormone medicine may change. ? You will need to have blood tests regularly so that your health care provider can monitor your condition. Contact a health care provider if:  Your symptoms do not get better with treatment.  You are taking thyroid replacement medicine and you: ? Sweat a lot. ? Have tremors. ? Feel anxious. ? Lose weight rapidly. ? Cannot tolerate heat. ? Have emotional swings. ? Have diarrhea. ? Feel weak. Get help right away if you have:  Chest pain.  An irregular heartbeat.  A rapid heartbeat.  Difficulty breathing. Summary  Hypothyroidism is when the thyroid gland does not make enough of certain hormones (it is underactive).  When the thyroid is underactive, it produces too little of the hormones thyroxine (T4) and triiodothyronine (T3).  The most common cause is Hashimoto's disease, a disease in which the body's disease-fighting system (immune system) attacks the thyroid gland. The condition can also be caused by viral infections, medicine, pregnancy, or past   radiation treatment to the head or neck.  Symptoms may include weight gain, dry skin, constipation, feeling as though you do not have energy, and not being able to tolerate cold.  This condition is treated with medicine to replace the thyroid hormones that your body does not make. This information  is not intended to replace advice given to you by your health care provider. Make sure you discuss any questions you have with your health care provider. Document Revised: 10/24/2017 Document Reviewed: 10/22/2017 Elsevier Patient Education  2020 Elsevier Inc.  

## 2020-05-11 NOTE — Progress Notes (Signed)
Subjective:  Patient ID: Shari Schmitt, female    DOB: 08-13-1941  Age: 79 y.o. MRN: [id number]  CC: Hypothyroidism  This visit occurred during the SARS-CoV-2 public health emergency.  Safety protocols were in place, including screening questions prior to the visit, additional usage of staff PPE, and extensive cleaning of exam room while observing appropriate contact time as indicated for disinfecting solutions.    HPI Shari Schmitt presents for f/up - She feels well and offers no complaints.  Outpatient Medications Prior to Visit  Medication Sig Dispense Refill  . albuterol (PROAIR HFA) 108 (90 Base) MCG/ACT inhaler Inhale 2 puffs into the lungs every 6 (six) hours as needed. 3 Inhaler 2  . CVS D3 50 MCG (2000 UT) CAPS TAKE 1 CAPSULE BY MOUTH EVERY DAY 90 capsule 1  . esomeprazole (NEXIUM) 40 MG capsule TAKE 1 CAPSULE BY MOUTH EVERY DAY IN THE MORNING BEFORE BREAKFAST 90 capsule 0  . fluocinonide gel (LIDEX) 0.05 % APPLY TO AFFECTED AREA(S)  TOPICALLY TWO TIMES DAILY  AS NEEDED 60 g 3  . ursodiol (ACTIGALL) 300 MG capsule Take 1 capsule (300 mg total) by mouth 3 (three) times daily. 270 capsule 1  . levothyroxine (SYNTHROID) 50 MCG tablet TAKE 1 TABLET BY MOUTH EVERY DAY 90 tablet 0  . Multiple Vitamin (MULTIVITAMIN) tablet Take 1 tablet by mouth daily.      . Suvorexant (BELSOMRA) 10 MG TABS Take 1 tablet by mouth at bedtime as needed. 30 tablet 5   No facility-administered medications prior to visit.    ROS Review of Systems  Constitutional: Negative for chills, diaphoresis, fatigue and unexpected weight change.  HENT: Negative.   Eyes: Negative.   Respiratory: Negative for cough, chest tightness, shortness of breath and wheezing.   Cardiovascular: Negative for chest pain, palpitations and leg swelling.  Gastrointestinal: Negative for abdominal pain, constipation, diarrhea, nausea and vomiting.  Endocrine: Negative for cold intolerance and heat intolerance.  Genitourinary:  Negative.  Negative for difficulty urinating, dysuria and urgency.  Musculoskeletal: Negative for arthralgias and myalgias.  Skin: Negative.  Negative for color change and pallor.  Neurological: Negative for dizziness, weakness and light-headedness.  Hematological: Negative for adenopathy. Does not bruise/bleed easily.  Psychiatric/Behavioral: Negative.     Objective:  BP (!) 144/70 (BP Location: Left Arm, Patient Position: Sitting, Cuff Size: Normal)   Pulse 87   Temp 98.1 F (36.7 C) (Oral)   Ht 5\' 2"  (1.575 m)   Wt 98 lb 6 oz (44.6 kg)   SpO2 99%   BMI 17.99 kg/m   BP Readings from Last 3 Encounters:  05/11/20 (!) 144/70  11/04/19 (!) 144/60  07/06/18 140/80    Wt Readings from Last 3 Encounters:  05/11/20 98 lb 6 oz (44.6 kg)  11/04/19 101 lb 4 oz (45.9 kg)  07/06/18 113 lb 12 oz (51.6 kg)    Physical Exam Vitals reviewed.  Constitutional:      Appearance: Normal appearance.  HENT:     Nose: Nose normal.     Mouth/Throat:     Mouth: Mucous membranes are moist.  Eyes:     General: No scleral icterus.    Conjunctiva/sclera: Conjunctivae normal.  Cardiovascular:     Rate and Rhythm: Normal rate and regular rhythm.     Heart sounds: No murmur heard.   Pulmonary:     Effort: Pulmonary effort is normal.     Breath sounds: No stridor. No wheezing, rhonchi or rales.  Abdominal:  General: Abdomen is flat. Bowel sounds are normal. There is no distension.     Palpations: Abdomen is soft. There is no hepatomegaly, splenomegaly or mass.     Tenderness: There is no abdominal tenderness.  Musculoskeletal:        General: Normal range of motion.     Cervical back: Neck supple.     Right lower leg: No edema.     Left lower leg: No edema.  Lymphadenopathy:     Cervical: No cervical adenopathy.  Skin:    General: Skin is warm and dry.     Coloration: Skin is not pale.  Neurological:     General: No focal deficit present.     Mental Status: She is alert.    Psychiatric:        Mood and Affect: Mood normal.        Behavior: Behavior normal.     Lab Results  Component Value Date   WBC 6.6 11/04/2019   HGB 13.3 11/04/2019   HCT 40.9 11/04/2019   PLT 237.0 11/04/2019   GLUCOSE 89 05/11/2020   CHOL 204 (H) 11/04/2019   TRIG 87.0 11/04/2019   HDL 82.20 11/04/2019   LDLCALC 104 (H) 11/04/2019   ALT 9 11/04/2019   AST 17 11/04/2019   NA 136 05/11/2020   K 4.2 05/11/2020   CL 110 05/11/2020   CREATININE 2.99 (H) 05/11/2020   BUN 20 05/11/2020   CO2 18 (L) 05/11/2020   TSH 5.46 (H) 05/11/2020   INR 1.0 11/04/2019    DG Bone Density  Result Date: 05/16/2016  Date of study: 05/13/2016 Exam: DUAL X-RAY ABSORPTIOMETRY (DXA) FOR BONE MINERAL DENSITY (BMD) Instrument: [employer] Provider: PCP Indication: Follow-up for osteoporosis Comparison: none (please note that it is not possible to compare data from different instruments) Clinical data: Pt is a 79 y.o. female without h/o fractures. On calcium and vitamin D. Results:  Lumbar spine (L1-L4) Femoral neck (FN) T-score -1.1  RFN: -3.3 LFN: -3.1  Assessment: Patient has OSTEOPOROSIS according to the [facility] classification for osteoporosis (see below). Fracture risk: high Comments: the technical quality of the study is good Evaluation for secondary causes should be considered if clinically indicated. Recommend optimizing calcium (1200 mg/day) and vitamin D (800 IU/day). Followup: Repeat BMD is appropriate after 2 years or after 1-2 years if starting treatment. WHO criteria for diagnosis of osteoporosis in postmenopausal women and in men 10 y/o or older: - normal: T-score -1.0 to + 1.0 - osteopenia/low bone density: T-score between -2.5 and -1.0 - osteoporosis: T-score below -2.5 - severe osteoporosis: T-score below -2.5 with history of fragility fracture Note: although not part of the WHO classification, the presence of a fragility fracture, regardless of the T-score, should be considered  diagnostic of osteoporosis, provided other causes for the fracture have been excluded. Treatment: The National Osteoporosis Foundation recommends that treatment be considered in postmenopausal women and men age 66 or older with: 1. Hip or vertebral (clinical or morphometric) fracture 2. T-score of - 2.5 or lower at the spine or hip 3. 10-year fracture probability by FRAX of at least 20% for a major osteoporotic fracture and 3% for a hip fracture Philemon Kingdom, MD [facility]    Assessment & Plan:   Alysa was seen today for hypothyroidism.  Diagnoses and all orders for this visit:  Acquired hypothyroidism- Her TSH is in an acceptable level at 5.46.  She will continue the current dose of levothyroxine. -  TSH; Future -     TSH -     levothyroxine (SYNTHROID) 50 MCG tablet; Take 1 tablet (50 mcg total) by mouth daily.  Stage 3b chronic kidney disease- Her creatinine clearance is down to 10 and she has a normal gap acidosis.  I recommended that she see nephrology. -     Basic metabolic panel; Future -     Basic metabolic panel -     Ambulatory referral to Nephrology   I have discontinued Norrine C. Everingham's multivitamin and Belsomra. I have also changed her levothyroxine. Additionally, I am having her maintain her albuterol, ursodiol, fluocinonide gel, CVS D3, and esomeprazole.  Meds ordered this encounter  Medications  . levothyroxine (SYNTHROID) 50 MCG tablet    Sig: Take 1 tablet (50 mcg total) by mouth daily.    Dispense:  90 tablet    Refill:  1     Follow-up: Return in about 4 months (around 09/10/2020).  Scarlette Calico, MD

## 2020-05-15 ENCOUNTER — Telehealth: Payer: Self-pay | Admitting: Internal Medicine

## 2020-05-15 NOTE — Telephone Encounter (Signed)
New message:   Pt is calling and states she is still having trouble with the laryngitis the dr diagnose her with at her last visit. Pt is wondering if something can be called into the pharmacy for her on [address]. Please advise.

## 2020-05-16 ENCOUNTER — Other Ambulatory Visit: Payer: Self-pay | Admitting: Internal Medicine

## 2020-05-16 DIAGNOSIS — K219 Gastro-esophageal reflux disease without esophagitis: Secondary | ICD-10-CM

## 2020-05-16 MED ORDER — ESOMEPRAZOLE MAGNESIUM 40 MG PO CPDR: DELAYED_RELEASE_CAPSULE | ORAL | 0 refills | Status: DC

## 2020-05-16 NOTE — Telephone Encounter (Signed)
Pt wanted to know if there was anything OTC or rx she can take to help with the "laryngitis". Pt states she was diagnosed with it at her last visit.

## 2020-05-17 NOTE — Telephone Encounter (Signed)
Tried to call pt but no vm to leave a message.

## 2020-05-18 NOTE — Telephone Encounter (Signed)
Pt contacted and she stated that she has had a hoarseness in her voice for the last 2 weeks, no cough. Symptoms are not worsening. Pt stated that she is not taking an allergy medication and pt states that she does nasal drainage to the back of her throat.   Pt would like to know if there is anything that she can take OTC or rx to help with the hoarseness.

## 2020-05-18 NOTE — Telephone Encounter (Signed)
Tried to call pt 

## 2020-05-22 NOTE — Telephone Encounter (Signed)
Call to number. VM is full and not able to lvm.

## 2020-06-16 ENCOUNTER — Telehealth: Payer: Self-pay | Admitting: Internal Medicine

## 2020-06-16 DIAGNOSIS — N1832 Chronic kidney disease, stage 3b: Secondary | ICD-10-CM

## 2020-06-16 NOTE — Telephone Encounter (Signed)
New message:   Pt is calling and states Dr. Ronnald Ramp was wanting the pt to schedule a visit with Dr. Loletha Grayer the Kidney dr but they are needing a referral since it has been more that 3 years. Please advise.

## 2020-06-19 NOTE — Telephone Encounter (Signed)
Referral sent to [facility]

## 2020-06-19 NOTE — Telephone Encounter (Signed)
Referral for nephrology has been entered.

## 2020-07-17 DIAGNOSIS — Z7689 Persons encountering health services in other specified circumstances: Secondary | ICD-10-CM | POA: Diagnosis not present

## 2020-07-17 DIAGNOSIS — E872 Acidosis: Secondary | ICD-10-CM | POA: Diagnosis not present

## 2020-07-17 DIAGNOSIS — D631 Anemia in chronic kidney disease: Secondary | ICD-10-CM | POA: Diagnosis not present

## 2020-07-17 DIAGNOSIS — I959 Hypotension, unspecified: Secondary | ICD-10-CM | POA: Diagnosis not present

## 2020-07-17 DIAGNOSIS — N184 Chronic kidney disease, stage 4 (severe): Secondary | ICD-10-CM | POA: Diagnosis not present

## 2020-07-17 DIAGNOSIS — N2581 Secondary hyperparathyroidism of renal origin: Secondary | ICD-10-CM | POA: Diagnosis not present

## 2020-07-17 DIAGNOSIS — N189 Chronic kidney disease, unspecified: Secondary | ICD-10-CM | POA: Diagnosis not present

## 2020-07-17 DIAGNOSIS — N390 Urinary tract infection, site not specified: Secondary | ICD-10-CM | POA: Diagnosis not present

## 2020-07-24 ENCOUNTER — Other Ambulatory Visit: Payer: Self-pay | Admitting: Internal Medicine

## 2020-07-24 DIAGNOSIS — K745 Biliary cirrhosis, unspecified: Secondary | ICD-10-CM

## 2020-09-01 DIAGNOSIS — D631 Anemia in chronic kidney disease: Secondary | ICD-10-CM | POA: Diagnosis not present

## 2020-09-01 DIAGNOSIS — N184 Chronic kidney disease, stage 4 (severe): Secondary | ICD-10-CM | POA: Diagnosis not present

## 2020-09-01 DIAGNOSIS — E872 Acidosis: Secondary | ICD-10-CM | POA: Diagnosis not present

## 2020-09-01 DIAGNOSIS — N2581 Secondary hyperparathyroidism of renal origin: Secondary | ICD-10-CM | POA: Diagnosis not present

## 2020-10-04 DIAGNOSIS — N183 Chronic kidney disease, stage 3 unspecified: Secondary | ICD-10-CM | POA: Diagnosis not present

## 2020-10-04 DIAGNOSIS — D631 Anemia in chronic kidney disease: Secondary | ICD-10-CM | POA: Diagnosis not present

## 2020-10-04 DIAGNOSIS — N2581 Secondary hyperparathyroidism of renal origin: Secondary | ICD-10-CM | POA: Diagnosis not present

## 2020-10-04 DIAGNOSIS — E872 Acidosis: Secondary | ICD-10-CM | POA: Diagnosis not present

## 2020-10-04 DIAGNOSIS — Z7689 Persons encountering health services in other specified circumstances: Secondary | ICD-10-CM | POA: Diagnosis not present

## 2020-10-04 DIAGNOSIS — N185 Chronic kidney disease, stage 5: Secondary | ICD-10-CM | POA: Diagnosis not present

## 2020-10-04 DIAGNOSIS — I959 Hypotension, unspecified: Secondary | ICD-10-CM | POA: Diagnosis not present

## 2020-10-04 LAB — CBC AND DIFFERENTIAL
HCT: 37 (ref 36–46)
Hemoglobin: 11.3 — AB (ref 12.0–16.0)
Platelets: 280 (ref 150–399)
WBC: 5.4

## 2020-10-15 ENCOUNTER — Other Ambulatory Visit: Payer: Self-pay | Admitting: Internal Medicine

## 2020-10-15 DIAGNOSIS — K745 Biliary cirrhosis, unspecified: Secondary | ICD-10-CM

## 2020-11-02 ENCOUNTER — Other Ambulatory Visit: Payer: Self-pay | Admitting: Internal Medicine

## 2020-11-02 DIAGNOSIS — K219 Gastro-esophageal reflux disease without esophagitis: Secondary | ICD-10-CM

## 2020-11-08 ENCOUNTER — Other Ambulatory Visit: Payer: Self-pay | Admitting: Internal Medicine

## 2020-11-08 DIAGNOSIS — E039 Hypothyroidism, unspecified: Secondary | ICD-10-CM

## 2020-11-14 DIAGNOSIS — N185 Chronic kidney disease, stage 5: Secondary | ICD-10-CM | POA: Diagnosis not present

## 2020-11-14 DIAGNOSIS — E872 Acidosis: Secondary | ICD-10-CM | POA: Diagnosis not present

## 2020-11-14 DIAGNOSIS — I120 Hypertensive chronic kidney disease with stage 5 chronic kidney disease or end stage renal disease: Secondary | ICD-10-CM | POA: Diagnosis not present

## 2020-11-14 DIAGNOSIS — D631 Anemia in chronic kidney disease: Secondary | ICD-10-CM | POA: Diagnosis not present

## 2020-11-14 DIAGNOSIS — N2581 Secondary hyperparathyroidism of renal origin: Secondary | ICD-10-CM | POA: Diagnosis not present

## 2020-11-14 LAB — COMPREHENSIVE METABOLIC PANEL
Albumin: 4 (ref 3.5–5.0)
Calcium: 9 (ref 8.7–10.7)
GFR calc non Af Amer: 15

## 2020-11-14 LAB — BASIC METABOLIC PANEL
BUN: 24 — AB (ref 4–21)
CO2: 21 (ref 13–22)
Chloride: 108 (ref 99–108)
Creatinine: 2.9 — AB (ref 0.5–1.1)
Glucose: 81
Potassium: 4.2 (ref 3.4–5.3)
Sodium: 138 (ref 137–147)

## 2020-11-22 ENCOUNTER — Other Ambulatory Visit: Payer: Self-pay | Admitting: Internal Medicine

## 2020-11-22 DIAGNOSIS — E039 Hypothyroidism, unspecified: Secondary | ICD-10-CM

## 2020-11-26 ENCOUNTER — Other Ambulatory Visit: Payer: Self-pay | Admitting: Internal Medicine

## 2020-11-26 DIAGNOSIS — E039 Hypothyroidism, unspecified: Secondary | ICD-10-CM

## 2020-11-29 ENCOUNTER — Telehealth: Payer: Self-pay | Admitting: Internal Medicine

## 2020-11-29 ENCOUNTER — Ambulatory Visit: Payer: Medicare Other

## 2020-11-29 ENCOUNTER — Ambulatory Visit (INDEPENDENT_AMBULATORY_CARE_PROVIDER_SITE_OTHER): Payer: Medicare Other | Admitting: Internal Medicine

## 2020-11-29 ENCOUNTER — Other Ambulatory Visit: Payer: Self-pay

## 2020-11-29 ENCOUNTER — Encounter: Payer: Self-pay | Admitting: Internal Medicine

## 2020-11-29 VITALS — BP 126/74 | HR 91 | Temp 98.2°F | Ht 62.0 in | Wt 107.0 lb

## 2020-11-29 DIAGNOSIS — K745 Biliary cirrhosis, unspecified: Secondary | ICD-10-CM

## 2020-11-29 DIAGNOSIS — N1832 Chronic kidney disease, stage 3b: Secondary | ICD-10-CM | POA: Diagnosis not present

## 2020-11-29 DIAGNOSIS — E039 Hypothyroidism, unspecified: Secondary | ICD-10-CM

## 2020-11-29 DIAGNOSIS — Z23 Encounter for immunization: Secondary | ICD-10-CM | POA: Diagnosis not present

## 2020-11-29 LAB — HEPATIC FUNCTION PANEL
ALT: 8 U/L (ref 0–35)
AST: 17 U/L (ref 0–37)
Albumin: 3.9 g/dL (ref 3.5–5.2)
Alkaline Phosphatase: 95 U/L (ref 39–117)
Bilirubin, Direct: 0.1 mg/dL (ref 0.0–0.3)
Total Bilirubin: 0.4 mg/dL (ref 0.2–1.2)
Total Protein: 7.4 g/dL (ref 6.0–8.3)

## 2020-11-29 LAB — TSH: TSH: 33.22 u[IU]/mL — ABNORMAL HIGH (ref 0.35–4.50)

## 2020-11-29 MED ORDER — LEVOTHYROXINE SODIUM 50 MCG PO TABS: 50.0000 ug | ORAL_TABLET | Freq: Every day | ORAL | 1 refills | Status: DC

## 2020-11-29 NOTE — Telephone Encounter (Signed)
Called pt to r/s AWV with [facility]. Patient states she will call me back to r/s appt with [facility].

## 2020-11-29 NOTE — Progress Notes (Unsigned)
Subjective:  Patient ID: Shari Schmitt, female    DOB: [birth year]  Age: 80 y.o. MRN: [id number]  CC: Hypothyroidism  This visit occurred during the SARS-CoV-2 public health emergency.  Safety protocols were in place, including screening questions prior to the visit, additional usage of staff PPE, and extensive cleaning of exam room while observing appropriate contact time as indicated for disinfecting solutions.    HPI Shari Schmitt presents for f/up - According to prescription refills she would have run out of her thyroid supplement about a month ago.  She complains of fatigue but offers no other complaints.  She tells me that within the next month she will start hemodialysis.  Outpatient Medications Prior to Visit  Medication Sig Dispense Refill  . albuterol (PROAIR HFA) 108 (90 Base) MCG/ACT inhaler Inhale 2 puffs into the lungs every 6 (six) hours as needed. 3 Inhaler 2  . CVS D3 50 MCG (2000 UT) CAPS TAKE 1 CAPSULE BY MOUTH EVERY DAY 90 capsule 1  . esomeprazole (NEXIUM) 40 MG capsule TAKE 1 CAPSULE BY MOUTH EVERY DAY IN THE MORNING BEFORE BREAKFAST 90 capsule 0  . fluocinonide gel (LIDEX) 0.05 % APPLY TO AFFECTED AREA(S)  TOPICALLY TWO TIMES DAILY  AS NEEDED 60 g 3  . ursodiol (ACTIGALL) 300 MG capsule TAKE 1 CAPSULE BY MOUTH 3  TIMES DAILY 270 capsule 1  . levothyroxine (SYNTHROID) 50 MCG tablet Take 1 tablet (50 mcg total) by mouth daily. 90 tablet 1   No facility-administered medications prior to visit.    ROS Review of Systems  Constitutional: Positive for fatigue and unexpected weight change (wt gain). Negative for appetite change, chills, diaphoresis and fever.  HENT: Negative.   Eyes: Negative.   Respiratory: Negative for cough, chest tightness, shortness of breath and wheezing.   Cardiovascular: Negative for chest pain, palpitations and leg swelling.  Gastrointestinal: Negative for abdominal pain, constipation, diarrhea, nausea and vomiting.  Endocrine: Negative  for cold intolerance and heat intolerance.  Genitourinary: Negative.  Negative for difficulty urinating and dysuria.  Musculoskeletal: Negative for arthralgias, joint swelling and myalgias.  Skin: Negative.  Negative for color change, pallor and rash.  Neurological: Negative.  Negative for dizziness, weakness and light-headedness.  Hematological: Negative for adenopathy. Does not bruise/bleed easily.  Psychiatric/Behavioral: Negative.     Objective:  BP 126/74   Pulse 91   Temp 98.2 F (36.8 C) (Oral)   Ht 5\' 2"  (1.575 m)   Wt 107 lb (48.5 kg)   SpO2 98%   BMI 19.57 kg/m   BP Readings from Last 3 Encounters:  11/29/20 126/74  05/11/20 (!) 144/70  11/04/19 (!) 144/60    Wt Readings from Last 3 Encounters:  11/29/20 107 lb (48.5 kg)  05/11/20 98 lb 6 oz (44.6 kg)  11/04/19 101 lb 4 oz (45.9 kg)    Physical Exam Vitals reviewed.  Constitutional:      Appearance: Normal appearance.  HENT:     Nose: Nose normal.     Mouth/Throat:     Mouth: Mucous membranes are moist.  Eyes:     General: No scleral icterus.    Conjunctiva/sclera: Conjunctivae normal.  Cardiovascular:     Rate and Rhythm: Normal rate and regular rhythm.     Heart sounds: No murmur heard.   Pulmonary:     Effort: Pulmonary effort is normal.     Breath sounds: No stridor. No wheezing, rhonchi or rales.  Abdominal:     General: Abdomen is flat.  Palpations: There is no mass.     Tenderness: There is no abdominal tenderness. There is no guarding.  Musculoskeletal:        General: Normal range of motion.     Cervical back: Neck supple.     Right lower leg: No edema.     Left lower leg: No edema.  Lymphadenopathy:     Cervical: No cervical adenopathy.  Skin:    General: Skin is warm and dry.  Neurological:     General: No focal deficit present.     Mental Status: She is alert.     Lab Results  Component Value Date   WBC 5.4 10/04/2020   HGB 11.3 (A) 10/04/2020   HCT 37 10/04/2020    PLT 280 10/04/2020   GLUCOSE 89 05/11/2020   CHOL 204 (H) 11/04/2019   TRIG 87.0 11/04/2019   HDL 82.20 11/04/2019   LDLCALC 104 (H) 11/04/2019   ALT 8 11/29/2020   AST 17 11/29/2020   NA 138 11/14/2020   K 4.2 11/14/2020   CL 108 11/14/2020   CREATININE 2.9 (A) 11/14/2020   BUN 24 (A) 11/14/2020   CO2 21 11/14/2020   TSH 33.22 (H) 11/29/2020   INR 1.0 11/04/2019    DG Bone Density  Result Date: 05/16/2016  Date of study: 05/13/2016 Exam: DUAL X-RAY ABSORPTIOMETRY (DXA) FOR BONE MINERAL DENSITY (BMD) Instrument: [employer] Provider: PCP Indication: Follow-up for osteoporosis Comparison: none (please note that it is not possible to compare data from different instruments) Clinical data: Pt is a 80 y.o. female without h/o fractures. On calcium and vitamin D. Results:  Lumbar spine (L1-L4) Femoral neck (FN) T-score -1.1  RFN: -3.3 LFN: -3.1  Assessment: Patient has OSTEOPOROSIS according to the [facility] classification for osteoporosis (see below). Fracture risk: high Comments: the technical quality of the study is good Evaluation for secondary causes should be considered if clinically indicated. Recommend optimizing calcium (1200 mg/day) and vitamin D (800 IU/day). Followup: Repeat BMD is appropriate after 2 years or after 1-2 years if starting treatment. WHO criteria for diagnosis of osteoporosis in postmenopausal women and in men 48 y/o or older: - normal: T-score -1.0 to + 1.0 - osteopenia/low bone density: T-score between -2.5 and -1.0 - osteoporosis: T-score below -2.5 - severe osteoporosis: T-score below -2.5 with history of fragility fracture Note: although not part of the WHO classification, the presence of a fragility fracture, regardless of the T-score, should be considered diagnostic of osteoporosis, provided other causes for the fracture have been excluded. Treatment: The National Osteoporosis Foundation recommends that treatment be considered in postmenopausal women and  men age 47 or older with: 1. Hip or vertebral (clinical or morphometric) fracture 2. T-score of - 2.5 or lower at the spine or hip 3. 10-year fracture probability by FRAX of at least 20% for a major osteoporotic fracture and 3% for a hip fracture Philemon Kingdom, MD [facility]    Assessment & Plan:   Cyndi was seen today for hypothyroidism.  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  Will continue the current dose of ursodiol. -     Hepatic function panel; Future -     Hepatic function panel  Stage 3b chronic kidney disease ([place])- She will be starting hemodialysis soon.  Acquired hypothyroidism- Her TSH is elevated and clinically she appears hypothyroid.  Will restart levothyroxine. -  TSH; Future -     TSH -     levothyroxine (SYNTHROID) 50 MCG tablet; Take 1 tablet (50 mcg total) by mouth daily.   I am having Mauri C. Tretter maintain her albuterol, fluocinonide gel, CVS D3, ursodiol, esomeprazole, and levothyroxine.  Meds ordered this encounter  Medications  . levothyroxine (SYNTHROID) 50 MCG tablet    Sig: Take 1 tablet (50 mcg total) by mouth daily.    Dispense:  90 tablet    Refill:  1     Follow-up: No follow-ups on file.  Scarlette Calico, MD

## 2020-12-14 ENCOUNTER — Other Ambulatory Visit: Payer: Self-pay

## 2020-12-14 DIAGNOSIS — N1832 Chronic kidney disease, stage 3b: Secondary | ICD-10-CM

## 2021-01-09 ENCOUNTER — Ambulatory Visit: Payer: Medicare Other | Admitting: Vascular Surgery

## 2021-01-09 ENCOUNTER — Encounter: Payer: Self-pay | Admitting: Vascular Surgery

## 2021-01-09 ENCOUNTER — Other Ambulatory Visit: Payer: Self-pay

## 2021-01-09 ENCOUNTER — Ambulatory Visit (INDEPENDENT_AMBULATORY_CARE_PROVIDER_SITE_OTHER)
Admission: RE | Admit: 2021-01-09 | Discharge: 2021-01-09 | Disposition: A | Discharge status: Home / Self Care | Payer: Medicare Other | Source: Ambulatory Visit | Attending: Vascular Surgery | Admitting: Vascular Surgery

## 2021-01-09 ENCOUNTER — Ambulatory Visit (HOSPITAL_COMMUNITY)
Admission: RE | Admit: 2021-01-09 | Discharge: 2021-01-09 | Disposition: A | Discharge status: Home / Self Care | Payer: Medicare Other | Source: Ambulatory Visit | Attending: Vascular Surgery | Admitting: Vascular Surgery

## 2021-01-09 VITALS — BP 118/67 | HR 82 | Temp 97.3°F | Resp 20 | Ht 62.0 in | Wt 102.0 lb

## 2021-01-09 DIAGNOSIS — N1832 Chronic kidney disease, stage 3b: Secondary | ICD-10-CM | POA: Insufficient documentation

## 2021-01-09 DIAGNOSIS — N184 Chronic kidney disease, stage 4 (severe): Secondary | ICD-10-CM | POA: Diagnosis not present

## 2021-01-09 NOTE — Progress Notes (Signed)
ASSESSMENT & PLAN:  Shari Schmitt is a 80 y.o. right handed female in need of permanent hemodialysis access. I reviewed options for dialysis in detail with the patient. I counseled the patient that dialysis access requires surveillance and periodic maintenance. Plan to proceed with left brachio-cephalic AVF 1/61/09.  CHIEF COMPLAINT:   Needs HD access  HISTORY:  HISTORY OF PRESENT ILLNESS: Shari Schmitt is a 80 y.o. female with CKDIV nearing ESRD in need of permanent HD access. She is right handed. She has never had a central venous catheter or pacemaker. She has had orthopedic surgery on bilateral medial elbows (? Ulnar nerve release). No other history of surgery or trauma to the upper extremities.    Past Medical History:  Diagnosis Date  . Anxiety   . Asthma   . Biliary cirrhosis ([place])   . Celiac disease   . Dermatitis herpetiformis   . Diverticulitis   . Hyperthyroidism   . Hypotension   . Kidney disease   . Pancreas disorder   . Renal insufficiency     Past Surgical History:  Procedure Laterality Date  . [place]   80% removed  . THYROIDECTOMY  1965    Family History  Problem Relation Age of Onset  . Emphysema Father        deceased  . Heart disease Mother        deceased  . Kidney disease Brother   . Colon cancer Neg Hx     Social History   Socioeconomic History  . Marital status: Legally Separated    Spouse name: Not on file  . Number of children: 4  . Years of education: Not on file  . Highest education level: Not on file  Occupational History  . Occupation: retired    Fish farm manager: RETIRED  Tobacco Use  . Smoking status: Former Smoker    Types: Cigarettes    Quit date: 11/25/1981    Years since quitting: 39.1  . Smokeless tobacco: Never Used  Vaping Use  . Vaping Use: Never used  Substance and Sexual Activity  . Alcohol use: No  . Drug use: No  . Sexual activity: Not on file  Other Topics Concern  . Not on file  Social  History Narrative   Retired Engineer, manufacturing systems   5 sons- oldest son deceased due to injury   57 living sons all live locally   She lives alone   Completed the 10th grade   Legally separated   Enjoys gardening.            Social Determinants of Health   Financial Resource Strain: Not on file  Food Insecurity: Not on file  Transportation Needs: Not on file  Physical Activity: Not on file  Stress: Not on file  Social Connections: Not on file  Intimate Partner Violence: Not on file    Allergies  Allergen Reactions  . Codeine     Asthma exacerbation  . Gluten Meal Rash    Current Outpatient Medications  Medication Sig Dispense Refill  . albuterol (PROAIR HFA) 108 (90 Base) MCG/ACT inhaler Inhale 2 puffs into the lungs every 6 (six) hours as needed. 3 Inhaler 2  . CVS D3 50 MCG (2000 UT) CAPS TAKE 1 CAPSULE BY MOUTH EVERY DAY 90 capsule 1  . esomeprazole (NEXIUM) 40 MG capsule TAKE 1 CAPSULE BY MOUTH EVERY DAY IN THE MORNING BEFORE BREAKFAST 90 capsule 0  . fluocinonide gel (LIDEX) 0.05 % APPLY TO  AFFECTED AREA(S)  TOPICALLY TWO TIMES DAILY  AS NEEDED 60 g 3  . levothyroxine (SYNTHROID) 50 MCG tablet Take 1 tablet (50 mcg total) by mouth daily. 90 tablet 1  . sodium bicarbonate 650 MG tablet Take by mouth.    . ursodiol (ACTIGALL) 300 MG capsule TAKE 1 CAPSULE BY MOUTH 3  TIMES DAILY 270 capsule 1   No current facility-administered medications for this visit.    REVIEW OF SYSTEMS:  [X]  denotes positive finding, [ ]  denotes negative finding Cardiac  Comments:  Chest pain or chest pressure:    Shortness of breath upon exertion:    Short of breath when lying flat:    Irregular heart rhythm:        Vascular    Pain in calf, thigh, or hip brought on by ambulation:    Pain in feet at night that wakes you up from your sleep:     Blood clot in your veins:    Leg swelling:         Pulmonary    Oxygen at home:    Productive cough:     Wheezing:  x       Neurologic    Sudden  weakness in arms or legs:     Sudden numbness in arms or legs:     Sudden onset of difficulty speaking or slurred speech:    Temporary loss of vision in one eye:     Problems with dizziness:         Gastrointestinal    Blood in stool:     Vomited blood:         Genitourinary    Burning when urinating:     Blood in urine:        Psychiatric    Major depression:         Hematologic    Bleeding problems:    Problems with blood clotting too easily:        Skin    Rashes or ulcers:        Constitutional    Fever or chills:     PHYSICAL EXAM:   Vitals:   01/09/21 1416  BP: 118/67  Pulse: 82  Resp: 20  Temp: (!) 97.3 F (36.3 C)  SpO2: 100%  Weight: 102 lb (46.3 kg)  Height: 5\' 2"  (1.575 m)   Constitutional: Elderly, chronically ill appearing in no distress. Appears under nourished.  Neurologic: CN intact. No focal findings. No sensory loss. Psychiatric: Mood and affect symmetric and appropriate. Eyes: No icterus. No conjunctival pallor. Ears, nose, throat: mucous membranes moist. Midline trachea.  Cardiac: regular rate and rhythm.  Respiratory: unlabored. Abdominal: soft, non-tender, non-distended.  Peripheral vascular:  2+ L brachial and radial pulse  L cephalic vein appears adequate for AVF  Extremity: No edema. No cyanosis. No pallor.  Skin: No gangrene. No ulceration.  Lymphatic: No Stemmer's sign. No palpable lymphadenopathy.   DATA REVIEW:    Most recent CBC CBC Latest Ref Rng & Units 10/04/2020 11/04/2019 07/06/2018  WBC - 5.4 6.6 6.6  Hemoglobin 12.0 - 16.0 11.3(A) 13.3 12.5  Hematocrit 36 - 46 37 40.9 37.8  Platelets 150 - 399 280 237.0 252.0     Most recent CMP CMP Latest Ref Rng & Units 11/29/2020 11/14/2020 05/11/2020  Glucose 70 - 99 mg/dL - - 89  BUN 4 - 21 - 24(A) 20  Creatinine 0.5 - 1.1 - 2.9(A) 2.99(H)  Sodium 137 - 147 - 138 136  Potassium 3.4 - 5.3 - 4.2 4.2  Chloride 99 - 108 - 108 110  CO2 13 - 22 - 21 18(L)  Calcium 8.7 - 10.7 -  9.0 8.8  Total Protein 6.0 - 8.3 g/dL 7.4 - -  Total Bilirubin 0.2 - 1.2 mg/dL 0.4 - -  Alkaline Phos 39 - 117 U/L 95 - -  AST 0 - 37 U/L 17 - -  ALT 0 - 35 U/L 8 - -    Renal function CrCl cannot be calculated (Patient's most recent lab result is older than the maximum 21 days allowed.).  No results found for: HGBA1C  LDL Cholesterol  Date Value Ref Range Status  11/04/2019 104 (H) 0 - 99 mg/dL Final     Preop AVF Duplex   Yevonne Aline. Stanford Breed, MD [facility] Phone Number: [phone] 01/09/2021 2:39 PM

## 2021-01-09 NOTE — H&P (View-Only) (Signed)
ASSESSMENT & PLAN:  Shari Schmitt is a 80 y.o. right handed female in need of permanent hemodialysis access. I reviewed options for dialysis in detail with the patient. I counseled the patient that dialysis access requires surveillance and periodic maintenance. Plan to proceed with left brachio-cephalic AVF 1/94/17.  CHIEF COMPLAINT:   Needs HD access  HISTORY:  HISTORY OF PRESENT ILLNESS: Shari Schmitt is a 80 y.o. female with CKDIV nearing ESRD in need of permanent HD access. She is right handed. She has never had a central venous catheter or pacemaker. She has had orthopedic surgery on bilateral medial elbows (? Ulnar nerve release). No other history of surgery or trauma to the upper extremities.    Past Medical History:  Diagnosis Date  . Anxiety   . Asthma   . Biliary cirrhosis ([place])   . Celiac disease   . Dermatitis herpetiformis   . Diverticulitis   . Hyperthyroidism   . Hypotension   . Kidney disease   . Pancreas disorder   . Renal insufficiency     Past Surgical History:  Procedure Laterality Date  . [place]   80% removed  . THYROIDECTOMY  1965    Family History  Problem Relation Age of Onset  . Emphysema Father        deceased  . Heart disease Mother        deceased  . Kidney disease Brother   . Colon cancer Neg Hx     Social History   Socioeconomic History  . Marital status: Legally Separated    Spouse name: Not on file  . Number of children: 4  . Years of education: Not on file  . Highest education level: Not on file  Occupational History  . Occupation: retired    Fish farm manager: RETIRED  Tobacco Use  . Smoking status: Former Smoker    Types: Cigarettes    Quit date: 11/25/1981    Years since quitting: 39.1  . Smokeless tobacco: Never Used  Vaping Use  . Vaping Use: Never used  Substance and Sexual Activity  . Alcohol use: No  . Drug use: No  . Sexual activity: Not on file  Other Topics Concern  . Not on file  Social  History Narrative   Retired Engineer, manufacturing systems   5 sons- oldest son deceased due to injury   61 living sons all live locally   She lives alone   Completed the 10th grade   Legally separated   Enjoys gardening.            Social Determinants of Health   Financial Resource Strain: Not on file  Food Insecurity: Not on file  Transportation Needs: Not on file  Physical Activity: Not on file  Stress: Not on file  Social Connections: Not on file  Intimate Partner Violence: Not on file    Allergies  Allergen Reactions  . Codeine     Asthma exacerbation  . Gluten Meal Rash    Current Outpatient Medications  Medication Sig Dispense Refill  . albuterol (PROAIR HFA) 108 (90 Base) MCG/ACT inhaler Inhale 2 puffs into the lungs every 6 (six) hours as needed. 3 Inhaler 2  . [facility] D3 50 MCG (2000 UT) CAPS TAKE 1 CAPSULE BY MOUTH EVERY DAY 90 capsule 1  . esomeprazole (NEXIUM) 40 MG capsule TAKE 1 CAPSULE BY MOUTH EVERY DAY IN THE MORNING BEFORE BREAKFAST 90 capsule 0  . fluocinonide gel (LIDEX) 0.05 % APPLY TO  AFFECTED AREA(S)  TOPICALLY TWO TIMES DAILY  AS NEEDED 60 g 3  . levothyroxine (SYNTHROID) 50 MCG tablet Take 1 tablet (50 mcg total) by mouth daily. 90 tablet 1  . sodium bicarbonate 650 MG tablet Take by mouth.    . ursodiol (ACTIGALL) 300 MG capsule TAKE 1 CAPSULE BY MOUTH 3  TIMES DAILY 270 capsule 1   No current facility-administered medications for this visit.    REVIEW OF SYSTEMS:  [X]  denotes positive finding, [ ]  denotes negative finding Cardiac  Comments:  Chest pain or chest pressure:    Shortness of breath upon exertion:    Short of breath when lying flat:    Irregular heart rhythm:        Vascular    Pain in calf, thigh, or hip brought on by ambulation:    Pain in feet at night that wakes you up from your sleep:     Blood clot in your veins:    Leg swelling:         Pulmonary    Oxygen at home:    Productive cough:     Wheezing:  x       Neurologic    Sudden  weakness in arms or legs:     Sudden numbness in arms or legs:     Sudden onset of difficulty speaking or slurred speech:    Temporary loss of vision in one eye:     Problems with dizziness:         Gastrointestinal    Blood in stool:     Vomited blood:         Genitourinary    Burning when urinating:     Blood in urine:        Psychiatric    Major depression:         Hematologic    Bleeding problems:    Problems with blood clotting too easily:        Skin    Rashes or ulcers:        Constitutional    Fever or chills:     PHYSICAL EXAM:   Vitals:   01/09/21 1416  BP: 118/67  Pulse: 82  Resp: 20  Temp: (!) 97.3 F (36.3 C)  SpO2: 100%  Weight: 102 lb (46.3 kg)  Height: 5\' 2"  (1.575 m)   Constitutional: Elderly, chronically ill appearing in no distress. Appears under nourished.  Neurologic: CN intact. No focal findings. No sensory loss. Psychiatric: Mood and affect symmetric and appropriate. Eyes: No icterus. No conjunctival pallor. Ears, nose, throat: mucous membranes moist. Midline trachea.  Cardiac: regular rate and rhythm.  Respiratory: unlabored. Abdominal: soft, non-tender, non-distended.  Peripheral vascular:  2+ L brachial and radial pulse  L cephalic vein appears adequate for AVF  Extremity: No edema. No cyanosis. No pallor.  Skin: No gangrene. No ulceration.  Lymphatic: No Stemmer's sign. No palpable lymphadenopathy.   DATA REVIEW:    Most recent CBC CBC Latest Ref Rng & Units 10/04/2020 11/04/2019 07/06/2018  WBC - 5.4 6.6 6.6  Hemoglobin 12.0 - 16.0 11.3(A) 13.3 12.5  Hematocrit 36 - 46 37 40.9 37.8  Platelets 150 - 399 280 237.0 252.0     Most recent CMP CMP Latest Ref Rng & Units 11/29/2020 11/14/2020 05/11/2020  Glucose 70 - 99 mg/dL - - 89  BUN 4 - 21 - 24(A) 20  Creatinine 0.5 - 1.1 - 2.9(A) 2.99(H)  Sodium 137 - 147 - 138 136  Potassium 3.4 - 5.3 - 4.2 4.2  Chloride 99 - 108 - 108 110  CO2 13 - 22 - 21 18(L)  Calcium 8.7 - 10.7 -  9.0 8.8  Total Protein 6.0 - 8.3 g/dL 7.4 - -  Total Bilirubin 0.2 - 1.2 mg/dL 0.4 - -  Alkaline Phos 39 - 117 U/L 95 - -  AST 0 - 37 U/L 17 - -  ALT 0 - 35 U/L 8 - -    Renal function CrCl cannot be calculated (Patient's most recent lab result is older than the maximum 21 days allowed.).  No results found for: HGBA1C  LDL Cholesterol  Date Value Ref Range Status  11/04/2019 104 (H) 0 - 99 mg/dL Final     Preop AVF Duplex   Yevonne Aline. Stanford Breed, MD [facility] Phone Number: [phone] 01/09/2021 2:39 PM

## 2021-01-11 ENCOUNTER — Other Ambulatory Visit (HOSPITAL_COMMUNITY)
Admission: RE | Admit: 2021-01-11 | Discharge: 2021-01-11 | Disposition: A | Discharge status: Home / Self Care | Payer: Medicare Other | Source: Ambulatory Visit | Attending: Vascular Surgery | Admitting: Vascular Surgery

## 2021-01-11 DIAGNOSIS — Z20822 Contact with and (suspected) exposure to covid-19: Secondary | ICD-10-CM | POA: Diagnosis not present

## 2021-01-11 DIAGNOSIS — Z01812 Encounter for preprocedural laboratory examination: Secondary | ICD-10-CM | POA: Insufficient documentation

## 2021-01-11 LAB — SARS CORONAVIRUS 2 (TAT 6-24 HRS): SARS Coronavirus 2: NEGATIVE

## 2021-01-12 ENCOUNTER — Encounter (HOSPITAL_COMMUNITY): Payer: Self-pay | Admitting: *Deleted

## 2021-01-12 ENCOUNTER — Other Ambulatory Visit: Payer: Self-pay

## 2021-01-12 NOTE — Progress Notes (Signed)
Patient denies shortness of breath, fever, cough or chest pain.  PCP - Dr Scarlette Calico Cardiologist - n/a Nephrology - Dr Gean Quint  Chest x-ray - n/a EKG - n/a Stress Test - n/a ECHO - 10/18/19 Cardiac Cath - n/a  STOP now taking any Aspirin (unless otherwise instructed by your surgeon), Aleve, Naproxen, Ibuprofen, Motrin, Advil, Goody's, BC's, all herbal medications, fish oil, and all vitamins.   Coronavirus Screening Covid test on 01/11/21 was negative.  Patient verbalized understanding of instructions that were given via phone.

## 2021-01-15 ENCOUNTER — Ambulatory Visit (HOSPITAL_COMMUNITY)
Admission: RE | Admit: 2021-01-15 | Discharge: 2021-01-15 | Disposition: A | Discharge status: Home / Self Care | Payer: Medicare Other | Attending: Vascular Surgery | Admitting: Vascular Surgery

## 2021-01-15 ENCOUNTER — Ambulatory Visit (HOSPITAL_COMMUNITY): Payer: Medicare Other | Admitting: Anesthesiology

## 2021-01-15 ENCOUNTER — Other Ambulatory Visit: Payer: Self-pay

## 2021-01-15 ENCOUNTER — Encounter (HOSPITAL_COMMUNITY): Payer: Self-pay | Admitting: Vascular Surgery

## 2021-01-15 ENCOUNTER — Encounter (HOSPITAL_COMMUNITY)
Admission: RE | Disposition: A | Discharge status: Home / Self Care | Payer: Self-pay | Source: Home / Self Care | Attending: Vascular Surgery

## 2021-01-15 DIAGNOSIS — I129 Hypertensive chronic kidney disease with stage 1 through stage 4 chronic kidney disease, or unspecified chronic kidney disease: Secondary | ICD-10-CM | POA: Diagnosis not present

## 2021-01-15 DIAGNOSIS — Z885 Allergy status to narcotic agent status: Secondary | ICD-10-CM | POA: Diagnosis not present

## 2021-01-15 DIAGNOSIS — Z79890 Hormone replacement therapy (postmenopausal): Secondary | ICD-10-CM | POA: Diagnosis not present

## 2021-01-15 DIAGNOSIS — J449 Chronic obstructive pulmonary disease, unspecified: Secondary | ICD-10-CM | POA: Diagnosis not present

## 2021-01-15 DIAGNOSIS — K219 Gastro-esophageal reflux disease without esophagitis: Secondary | ICD-10-CM | POA: Diagnosis not present

## 2021-01-15 DIAGNOSIS — Z79899 Other long term (current) drug therapy: Secondary | ICD-10-CM | POA: Diagnosis not present

## 2021-01-15 DIAGNOSIS — Z87891 Personal history of nicotine dependence: Secondary | ICD-10-CM | POA: Insufficient documentation

## 2021-01-15 DIAGNOSIS — N184 Chronic kidney disease, stage 4 (severe): Secondary | ICD-10-CM | POA: Diagnosis not present

## 2021-01-15 HISTORY — PX: AV FISTULA PLACEMENT: SHX1204

## 2021-01-15 HISTORY — DX: Chronic obstructive pulmonary disease, unspecified: J44.9

## 2021-01-15 HISTORY — DX: Hyperlipidemia, unspecified: E78.5

## 2021-01-15 HISTORY — DX: Gastro-esophageal reflux disease without esophagitis: K21.9

## 2021-01-15 LAB — POCT I-STAT, CHEM 8
BUN: 16 mg/dL (ref 8–23)
Calcium, Ion: 1.12 mmol/L — ABNORMAL LOW (ref 1.15–1.40)
Chloride: 108 mmol/L (ref 98–111)
Creatinine, Ser: 3.1 mg/dL — ABNORMAL HIGH (ref 0.44–1.00)
Glucose, Bld: 76 mg/dL (ref 70–99)
HCT: 35 % — ABNORMAL LOW (ref 36.0–46.0)
Hemoglobin: 11.9 g/dL — ABNORMAL LOW (ref 12.0–15.0)
Potassium: 3.3 mmol/L — ABNORMAL LOW (ref 3.5–5.1)
Sodium: 141 mmol/L (ref 135–145)
TCO2: 20 mmol/L — ABNORMAL LOW (ref 22–32)

## 2021-01-15 SURGERY — ARTERIOVENOUS (AV) FISTULA CREATION
Anesthesia: Monitor Anesthesia Care | Site: Arm Upper | Laterality: Left

## 2021-01-15 MED ORDER — PHENYLEPHRINE HCL-NACL 10-0.9 MG/250ML-% IV SOLN
INTRAVENOUS | Status: DC | PRN
  Administered 2021-01-15: 50 ug/min via INTRAVENOUS

## 2021-01-15 MED ORDER — CEFAZOLIN SODIUM-DEXTROSE 2-4 GM/100ML-% IV SOLN: 2.0000 g | INTRAVENOUS | Status: DC

## 2021-01-15 MED ORDER — OXYCODONE HCL 5 MG PO TABS: 5.0000 mg | ORAL_TABLET | Freq: Once | ORAL | Status: DC | PRN

## 2021-01-15 MED ORDER — SODIUM CHLORIDE 0.9 % IV SOLN: INTRAVENOUS | Status: DC | PRN

## 2021-01-15 MED ORDER — 0.9 % SODIUM CHLORIDE (POUR BTL) OPTIME
TOPICAL | Status: DC | PRN
  Administered 2021-01-15: 1000 mL

## 2021-01-15 MED ORDER — TRAMADOL HCL 50 MG PO TABS: 50.0000 mg | ORAL_TABLET | Freq: Two times a day (BID) | ORAL | 0 refills | Status: DC | PRN

## 2021-01-15 MED ORDER — SODIUM CHLORIDE 0.9 % IV SOLN
INTRAVENOUS | Status: AC
  Filled 2021-01-15: qty 1.2

## 2021-01-15 MED ORDER — ORAL CARE MOUTH RINSE: 15.0000 mL | Freq: Once | OROMUCOSAL | Status: DC

## 2021-01-15 MED ORDER — SODIUM CHLORIDE 0.9 % IV SOLN: INTRAVENOUS | Status: DC

## 2021-01-15 MED ORDER — PHENYLEPHRINE HCL (PRESSORS) 10 MG/ML IV SOLN
INTRAVENOUS | Status: DC | PRN
  Administered 2021-01-15: 80 ug via INTRAVENOUS

## 2021-01-15 MED ORDER — CHLORHEXIDINE GLUCONATE 0.12 % MT SOLN: 15.0000 mL | Freq: Once | OROMUCOSAL | Status: AC

## 2021-01-15 MED ORDER — ONDANSETRON HCL 4 MG/2ML IJ SOLN: 4.0000 mg | Freq: Four times a day (QID) | INTRAMUSCULAR | Status: DC | PRN

## 2021-01-15 MED ORDER — CEFAZOLIN SODIUM-DEXTROSE 2-4 GM/100ML-% IV SOLN
INTRAVENOUS | Status: AC
  Filled 2021-01-15: qty 100

## 2021-01-15 MED ORDER — CHLORHEXIDINE GLUCONATE 0.12 % MT SOLN: 15.0000 mL | Freq: Once | OROMUCOSAL | Status: DC

## 2021-01-15 MED ORDER — CHLORHEXIDINE GLUCONATE 0.12 % MT SOLN
OROMUCOSAL | Status: AC
  Administered 2021-01-15: 15 mL via OROMUCOSAL
  Filled 2021-01-15: qty 15

## 2021-01-15 MED ORDER — MIDAZOLAM HCL 2 MG/2ML IJ SOLN
INTRAMUSCULAR | Status: AC
  Filled 2021-01-15: qty 2

## 2021-01-15 MED ORDER — CHLORHEXIDINE GLUCONATE 4 % EX LIQD: 60.0000 mL | Freq: Once | CUTANEOUS | Status: DC

## 2021-01-15 MED ORDER — FENTANYL CITRATE (PF) 100 MCG/2ML IJ SOLN: 50.0000 ug | Freq: Once | INTRAMUSCULAR | Status: AC

## 2021-01-15 MED ORDER — PROPOFOL 500 MG/50ML IV EMUL
INTRAVENOUS | Status: DC | PRN
  Administered 2021-01-15: 25 ug/kg/min via INTRAVENOUS

## 2021-01-15 MED ORDER — FENTANYL CITRATE (PF) 100 MCG/2ML IJ SOLN
INTRAMUSCULAR | Status: AC
  Administered 2021-01-15: 50 ug via INTRAVENOUS
  Filled 2021-01-15: qty 2

## 2021-01-15 MED ORDER — LIDOCAINE-EPINEPHRINE (PF) 1.5 %-1:200000 IJ SOLN
INTRAMUSCULAR | Status: DC | PRN
  Administered 2021-01-15: 25 mL via PERINEURAL

## 2021-01-15 MED ORDER — FENTANYL CITRATE (PF) 100 MCG/2ML IJ SOLN: 25.0000 ug | INTRAMUSCULAR | Status: DC | PRN

## 2021-01-15 MED ORDER — HEPARIN SODIUM (PORCINE) 1000 UNIT/ML IJ SOLN
INTRAMUSCULAR | Status: DC | PRN
  Administered 2021-01-15: 3000 [IU] via INTRAVENOUS

## 2021-01-15 MED ORDER — OXYCODONE HCL 5 MG/5ML PO SOLN: 5.0000 mg | Freq: Once | ORAL | Status: DC | PRN

## 2021-01-15 MED ORDER — PROPOFOL 10 MG/ML IV BOLUS
INTRAVENOUS | Status: DC | PRN
  Administered 2021-01-15 (×3): 10 mg via INTRAVENOUS

## 2021-01-15 MED ORDER — LIDOCAINE-EPINEPHRINE (PF) 1 %-1:200000 IJ SOLN
INTRAMUSCULAR | Status: AC
  Filled 2021-01-15: qty 30

## 2021-01-15 SURGICAL SUPPLY — 37 items
ADH SKN CLS APL DERMABOND .7 (GAUZE/BANDAGES/DRESSINGS) ×1
ADH SKN CLS LQ APL DERMABOND (GAUZE/BANDAGES/DRESSINGS) ×1
ARMBAND PINK RESTRICT EXTREMIT (MISCELLANEOUS) ×4 IMPLANT
CANISTER SUCT 3000ML PPV (MISCELLANEOUS) ×2 IMPLANT
CANNULA VESSEL 3MM 2 BLNT TIP (CANNULA) ×2 IMPLANT
CLIP VESOCCLUDE MED 6/CT (CLIP) ×2 IMPLANT
CLIP VESOCCLUDE SM WIDE 6/CT (CLIP) ×3 IMPLANT
COVER PROBE W GEL 5X96 (DRAPES) IMPLANT
COVER WAND RF STERILE (DRAPES) ×2 IMPLANT
DECANTER SPIKE VIAL GLASS SM (MISCELLANEOUS) ×2 IMPLANT
DERMABOND ADHESIVE PROPEN (GAUZE/BANDAGES/DRESSINGS) ×1
DERMABOND ADVANCED (GAUZE/BANDAGES/DRESSINGS) ×1
DERMABOND ADVANCED .7 DNX12 (GAUZE/BANDAGES/DRESSINGS) ×1 IMPLANT
DERMABOND ADVANCED .7 DNX6 (GAUZE/BANDAGES/DRESSINGS) IMPLANT
DRAIN PENROSE 1/4X12 LTX STRL (WOUND CARE) ×2 IMPLANT
ELECT REM PT RETURN 9FT ADLT (ELECTROSURGICAL) ×2
ELECTRODE REM PT RTRN 9FT ADLT (ELECTROSURGICAL) ×1 IMPLANT
GLOVE BIO SURGEON STRL SZ7.5 (GLOVE) ×2 IMPLANT
GLOVE BIOGEL M 6.5 STRL (GLOVE) ×3 IMPLANT
GLOVE SURG PR MICRO ENCORE 7.5 (GLOVE) ×4 IMPLANT
GOWN STRL REUS W/ TWL LRG LVL3 (GOWN DISPOSABLE) ×3 IMPLANT
GOWN STRL REUS W/TWL LRG LVL3 (GOWN DISPOSABLE) ×6
KIT BASIN OR (CUSTOM PROCEDURE TRAY) ×2 IMPLANT
KIT TURNOVER KIT B (KITS) ×2 IMPLANT
LOOP VESSEL MINI RED (MISCELLANEOUS) IMPLANT
NS IRRIG 1000ML POUR BTL (IV SOLUTION) ×2 IMPLANT
PACK CV ACCESS (CUSTOM PROCEDURE TRAY) ×2 IMPLANT
PAD ARMBOARD 7.5X6 YLW CONV (MISCELLANEOUS) ×4 IMPLANT
SPONGE SURGIFOAM ABS GEL 100 (HEMOSTASIS) IMPLANT
SUT PROLENE 6 0 CC (SUTURE) ×2 IMPLANT
SUT PROLENE 7 0 BV 1 (SUTURE) IMPLANT
SUT VIC AB 3-0 SH 27 (SUTURE) ×2
SUT VIC AB 3-0 SH 27X BRD (SUTURE) ×1 IMPLANT
SUT VICRYL 4-0 PS2 18IN ABS (SUTURE) ×2 IMPLANT
TOWEL GREEN STERILE (TOWEL DISPOSABLE) ×2 IMPLANT
UNDERPAD 30X36 HEAVY ABSORB (UNDERPADS AND DIAPERS) ×2 IMPLANT
WATER STERILE IRR 1000ML POUR (IV SOLUTION) ×2 IMPLANT

## 2021-01-15 NOTE — Anesthesia Preprocedure Evaluation (Signed)
Anesthesia Evaluation  Patient identified by MRN, date of birth, ID band Patient awake    Reviewed: Allergy & Precautions, H&P , NPO status , Patient's Chart, lab work & pertinent test results  Airway Mallampati: II   Neck ROM: full    Dental   Pulmonary asthma , COPD, former smoker,    breath sounds clear to auscultation       Cardiovascular negative cardio ROS   Rhythm:regular Rate:Normal     Neuro/Psych PSYCHIATRIC DISORDERS Anxiety    GI/Hepatic GERD  ,  Endo/Other  Hypothyroidism Hyperthyroidism   Renal/GU      Musculoskeletal  (+) Arthritis ,   Abdominal   Peds  Hematology   Anesthesia Other Findings   Reproductive/Obstetrics                             Anesthesia Physical Anesthesia Plan  ASA: III  Anesthesia Plan: MAC and Regional   Post-op Pain Management:    Induction: Intravenous  PONV Risk Score and Plan: 2 and Propofol infusion, Ondansetron and Treatment may vary due to age or medical condition  Airway Management Planned: Simple Face Mask  Additional Equipment:   Intra-op Plan:   Post-operative Plan:   Informed Consent: I have reviewed the patients History and Physical, chart, labs and discussed the procedure including the risks, benefits and alternatives for the proposed anesthesia with the patient or authorized representative who has indicated his/her understanding and acceptance.     Dental advisory given  Plan Discussed with: Anesthesiologist, Surgeon and CRNA  Anesthesia Plan Comments:         Anesthesia Quick Evaluation

## 2021-01-15 NOTE — Transfer of Care (Signed)
Immediate Anesthesia Transfer of Care Note  Patient: Shari Schmitt  Procedure(s) Performed: LEFT Brachial - cephalic  ARTERIOVENOUS (AV) FISTULA CREATION. (Left Arm Upper)  Patient Location: PACU  Anesthesia Type:MAC combined with regional for post-op pain  Level of Consciousness: awake, alert  and oriented  Airway & Oxygen Therapy: Patient Spontanous Breathing  Post-op Assessment: Report given to RN, Post -op Vital signs reviewed and stable and Patient moving all extremities  Post vital signs: Reviewed and stable  Last Vitals:  Vitals Value Taken Time  BP 133/100 01/15/21 1055  Temp    Pulse 94 01/15/21 1054  Resp 26 01/15/21 1055  SpO2 100 % 01/15/21 1054  Vitals shown include unvalidated device data.  Last Pain:  Vitals:   01/15/21 0757  TempSrc:   PainSc: 0-No pain         Complications: No complications documented.

## 2021-01-15 NOTE — Anesthesia Procedure Notes (Signed)
Anesthesia Regional Block: Supraclavicular block   Pre-Anesthetic Checklist: ,, timeout performed, Correct Patient, Correct Site, Correct Laterality, Correct Procedure, Correct Position, site marked, Risks and benefits discussed,  Surgical consent,  Pre-op evaluation,  At surgeon's request and post-op pain management  Laterality: Left  Prep: chloraprep       Needles:  Injection technique: Single-shot  Needle Type: Echogenic Stimulator Needle     Needle Length: 5cm  Needle Gauge: 22     Additional Needles:   Procedures:, nerve stimulator,,,,,,,   Nerve Stimulator or Paresthesia:  Response: biceps flexion, 0.45 mA,   Additional Responses:   Narrative:  Start time: 01/15/2021 9:01 AM End time: 01/15/2021 9:12 AM Injection made incrementally with aspirations every 5 mL.  Performed by: Personally  Anesthesiologist: Albertha Ghee, MD  Additional Notes: Functioning IV was confirmed and monitors were applied.  A 54mm 22ga Arrow echogenic stimulator needle was used. Sterile prep and drape,hand hygiene and sterile gloves were used.  Negative aspiration and negative test dose prior to incremental administration of local anesthetic. The patient tolerated the procedure well.  Ultrasound guidance: relevent anatomy identified, needle position confirmed, local anesthetic spread visualized around nerve(s), vascular puncture avoided.  Image printed for medical record.

## 2021-01-15 NOTE — Discharge Instructions (Signed)
° °  [facility] [place] ° °Discharge Instructions ° °AV Fistula or Graft Surgery for Dialysis Access ° °Please refer to the following instructions for your post-procedure care. Your surgeon or physician assistant will discuss any changes with you. ° °Activity ° °You may drive the day following your surgery, if you are comfortable and no longer taking prescription pain medication. Resume full activity as the soreness in your incision resolves. ° °Bathing/Showering ° °You may shower after you go home. Keep your incision dry for 48 hours. Do not soak in a bathtub, hot tub, or swim until the incision heals completely. You may not shower if you have a hemodialysis catheter. ° °Incision Care ° °Clean your incision with mild soap and water after 48 hours. Pat the area dry with a clean towel. You do not need a bandage unless otherwise instructed. Do not apply any ointments or creams to your incision. You may have skin glue on your incision. Do not peel it off. It will come off on its own in about one week. Your arm may swell a bit after surgery. To reduce swelling use pillows to elevate your arm so it is above your heart. Your doctor will tell you if you need to lightly wrap your arm with an ACE bandage. ° °Diet ° °Resume your normal diet. There are not special food restrictions following this procedure. In order to heal from your surgery, it is CRITICAL to get adequate nutrition. Your body requires vitamins, minerals, and protein. Vegetables are the best source of vitamins and minerals. Vegetables also provide the perfect balance of protein. Processed food has little nutritional value, so try to avoid this. ° °Medications ° °Resume taking all of your medications. If your incision is causing pain, you may take over-the counter pain relievers such as acetaminophen (Tylenol). If you were prescribed a stronger pain medication, please be aware these medications can cause nausea and constipation. Prevent  nausea by taking the medication with a snack or meal. Avoid constipation by drinking plenty of fluids and eating foods with high amount of fiber, such as fruits, vegetables, and grains. Do not take Tylenol if you are taking prescription pain medications. ° ° ° ° °Follow up °Your surgeon may want to see you in the office following your access surgery. If so, this will be arranged at the time of your surgery. ° °Please call us immediately for any of the following conditions: ° °Increased pain, redness, drainage (pus) from your incision site °Fever of 101 degrees or higher °Severe or worsening pain at your incision site °Hand pain or numbness. ° °Reduce your risk of vascular disease: ° °Stop smoking. If you would like help, call [facility] at 1-800-QUIT-NOW (1-800-784-8669) or [place] at 336-586-4000 ° °Manage your cholesterol °Maintain a desired weight °Control your diabetes °Keep your blood pressure down ° °Dialysis ° °It will take several weeks to several months for your new dialysis access to be ready for use. Your surgeon will determine when it is OK to use it. Your nephrologist will continue to direct your dialysis. You can continue to use your Permcath until your new access is ready for use. ° °If you have any questions, please call the office at [phone]. ° °

## 2021-01-15 NOTE — Interval H&P Note (Signed)
History and Physical Interval Note:  01/15/2021 8:49 AM  Shari Schmitt  has presented today for surgery, with the diagnosis of CHRONIC KIDNEY DISEASE STAGE IV.  The various methods of treatment have been discussed with the patient and family. After consideration of risks, benefits and other options for treatment, the patient has consented to  Procedure(s) with comments: LEFT UPPER EXTREMITY ARTERIOVENOUS (AV) FISTULA CREATION (Left) - PERIPHERAL NERVE BLOCK as a surgical intervention.  The patient's history has been reviewed, patient examined, no change in status, stable for surgery.  I have reviewed the patient's chart and labs.  Questions were answered to the patient's satisfaction.     Ruta Hinds

## 2021-01-15 NOTE — Op Note (Signed)
Procedure: Left brachiocephalic AV fistula  Preoperative diagnosis: CKD4  Postoperative diagnosis: Same  Anesthesia: Left upper extremity block  Operative findings: 3.5 mm left cephalic vein, 3 mm left brachial artery  Assistant: Arlee Muslim, PA-C for assistance in exposure creation of anastomosis and expediting procedure  Operative details: After obtaining form consent, patient taken the operating room.  The patient placed in supine position operating table.  Patient had had a left upper extremity block placed by the anesthesia team prior to arrival in the operating room.  Patient's left upper extremities prepped and draped in usual sterile fashion.  Transverse incision was made in the left arm over the antecubital area.  Incision was carried down through the subcutaneous tissues down the level of the left cephalic vein.  This was about 3-1/2 mm in diameter.  The adjacent brachial artery was dissected free circumferentially and was about 3 mm in diameter.  Small side branches were ligated and divided between silk ties or clips on the artery and the vein.  Patient was given 3000 units of intravenous heparin.  The distal cephalic vein was ligated with silk ties.  The vein was then transected and swung over lowly artery.  It was gently distended with heparinized saline.  The artery was controlled proximally distally with Vesseloops.  Longitudinal opening was made in the brachial artery and the vein sewn end of vein to side of brachial artery using a running 6-0 Prolene suture.  Despite completion anastomosis it was for blood backbled and thoroughly flushed anastomosis was secured Vesseloops released there is palpable thrill in the fistula immediately.  Patient had a weakly palpable radial pulse after opening the fistula.  Hemostasis was obtained.  Subcutaneous tissues were reapproximated using running 3-0 Vicryl suture.  Skin was closed with 4-0 Vicryl subcuticular stitch.  Patient tolerated procedure  well and there were no complications.  Instrument sponge needle counts correct in the case.  Patient taken to recovery in stable condition.  Ruta Hinds, MD [facility] Office: [phone]

## 2021-01-15 NOTE — Progress Notes (Signed)
Orthopedic Tech Progress Note Patient Details:  Shari Schmitt 07-03-1941 [id number] PACU RN called requesting an ARM SLING Ortho Devices Type of Ortho Device: Arm sling Ortho Device/Splint Interventions: Other (comment)   Post Interventions Patient Tolerated: Other (comment) Instructions Provided: Other (comment)   Janit Pagan 01/15/2021, 1:55 PM

## 2021-01-15 NOTE — Anesthesia Procedure Notes (Signed)
Procedure Name: MAC Date/Time: 01/15/2021 9:40 AM Performed by: Amadeo Garnet, CRNA Pre-anesthesia Checklist: Patient identified, Suction available, Emergency Drugs available and Patient being monitored Patient Re-evaluated:Patient Re-evaluated prior to induction Oxygen Delivery Method: Simple face mask Preoxygenation: Pre-oxygenation with 100% oxygen Induction Type: IV induction Placement Confirmation: positive ETCO2 Dental Injury: Teeth and Oropharynx as per pre-operative assessment

## 2021-01-16 ENCOUNTER — Encounter (HOSPITAL_COMMUNITY): Payer: Self-pay | Admitting: Vascular Surgery

## 2021-01-16 NOTE — Anesthesia Postprocedure Evaluation (Signed)
Anesthesia Post Note  Patient: Shari Schmitt  Procedure(s) Performed: LEFT Brachial - cephalic  ARTERIOVENOUS (AV) FISTULA CREATION. (Left Arm Upper)     Patient location during evaluation: PACU Anesthesia Type: Regional and MAC Level of consciousness: awake and alert Pain management: pain level controlled Vital Signs Assessment: post-procedure vital signs reviewed and stable Respiratory status: spontaneous breathing, nonlabored ventilation, respiratory function stable and patient connected to nasal cannula oxygen Cardiovascular status: stable and blood pressure returned to baseline Postop Assessment: no apparent nausea or vomiting Anesthetic complications: no   No complications documented.  Last Vitals:  Vitals:   01/15/21 1105 01/15/21 1115  BP: (!) 115/53 (!) 112/51  Pulse: 88 88  Resp: 19 19  Temp:  (!) 36.3 C  SpO2: 100% 100%    Last Pain:  Vitals:   01/15/21 1115  TempSrc:   PainSc: 0-No pain                 Murvin Gift S

## 2021-01-22 DIAGNOSIS — D631 Anemia in chronic kidney disease: Secondary | ICD-10-CM | POA: Diagnosis not present

## 2021-01-22 DIAGNOSIS — N2581 Secondary hyperparathyroidism of renal origin: Secondary | ICD-10-CM | POA: Diagnosis not present

## 2021-01-22 DIAGNOSIS — E872 Acidosis: Secondary | ICD-10-CM | POA: Diagnosis not present

## 2021-01-22 DIAGNOSIS — N185 Chronic kidney disease, stage 5: Secondary | ICD-10-CM | POA: Diagnosis not present

## 2021-01-22 DIAGNOSIS — I120 Hypertensive chronic kidney disease with stage 5 chronic kidney disease or end stage renal disease: Secondary | ICD-10-CM | POA: Diagnosis not present

## 2021-01-22 DIAGNOSIS — N189 Chronic kidney disease, unspecified: Secondary | ICD-10-CM | POA: Diagnosis not present

## 2021-02-17 ENCOUNTER — Other Ambulatory Visit: Payer: Self-pay

## 2021-02-17 DIAGNOSIS — N184 Chronic kidney disease, stage 4 (severe): Secondary | ICD-10-CM

## 2021-02-22 ENCOUNTER — Ambulatory Visit (HOSPITAL_COMMUNITY): Payer: Medicare Other | Attending: Vascular Surgery

## 2021-02-27 ENCOUNTER — Other Ambulatory Visit: Payer: Self-pay

## 2021-02-27 DIAGNOSIS — N184 Chronic kidney disease, stage 4 (severe): Secondary | ICD-10-CM

## 2021-02-28 DIAGNOSIS — D631 Anemia in chronic kidney disease: Secondary | ICD-10-CM | POA: Diagnosis not present

## 2021-02-28 DIAGNOSIS — I120 Hypertensive chronic kidney disease with stage 5 chronic kidney disease or end stage renal disease: Secondary | ICD-10-CM | POA: Diagnosis not present

## 2021-02-28 DIAGNOSIS — N2581 Secondary hyperparathyroidism of renal origin: Secondary | ICD-10-CM | POA: Diagnosis not present

## 2021-02-28 DIAGNOSIS — N185 Chronic kidney disease, stage 5: Secondary | ICD-10-CM | POA: Diagnosis not present

## 2021-02-28 DIAGNOSIS — E872 Acidosis: Secondary | ICD-10-CM | POA: Diagnosis not present

## 2021-02-28 DIAGNOSIS — I770 Arteriovenous fistula, acquired: Secondary | ICD-10-CM | POA: Diagnosis not present

## 2021-03-15 ENCOUNTER — Ambulatory Visit (HOSPITAL_COMMUNITY)
Admission: RE | Admit: 2021-03-15 | Discharge: 2021-03-15 | Disposition: A | Discharge status: Home / Self Care | Payer: Medicare Other | Source: Ambulatory Visit | Attending: Vascular Surgery | Admitting: Vascular Surgery

## 2021-03-15 ENCOUNTER — Ambulatory Visit (INDEPENDENT_AMBULATORY_CARE_PROVIDER_SITE_OTHER): Payer: Medicare Other | Admitting: Physician Assistant

## 2021-03-15 ENCOUNTER — Other Ambulatory Visit: Payer: Self-pay

## 2021-03-15 VITALS — BP 99/55 | HR 89 | Temp 98.3°F | Resp 20 | Ht 61.0 in | Wt 101.5 lb

## 2021-03-15 DIAGNOSIS — N184 Chronic kidney disease, stage 4 (severe): Secondary | ICD-10-CM | POA: Insufficient documentation

## 2021-03-15 NOTE — Progress Notes (Addendum)
    Postoperative Access Visit   History of Present Illness   Shari Schmitt is a 80 y.o. year old female who presents for postoperative follow-up for: left brachiocephalic arteriovenous fistula by Dr. Oneida Alar (Date: 01/15/21).  The patient's wounds are healed.  The patient denies steal symptoms.  She is not yet on hemodialysis.   Physical Examination   Vitals:   03/15/21 1340  BP: (!) 99/55  Pulse: 89  Resp: 20  Temp: 98.3 F (36.8 C)  TempSrc: Temporal  SpO2: 100%  Weight: 101 lb 8 oz (46 kg)  Height: 5\' 1"  (1.549 m)   Body mass index is 19.18 kg/m.  left arm Incision is healed, palpable radial pulse, hand grip is 5/5, sensation in digits is intact, palpable thrill, bruit can be auscultated     Medical Decision Making   Shari Schmitt is a 80 y.o. year old female who presents s/p left brachiocephalic arteriovenous fistula   Patent brachiocephalic fistula without signs or symptoms of steal syndrome  The patient's access will be ready for use 04/09/21 however can be used prior to that date to avoid [facility] placement if hemodialysis is initiated  The patient may follow up on a prn basis   Dagoberto Ligas PA-C [facility] [place] Office: [phone]  Clinic MD: Oneida Alar

## 2021-04-09 DIAGNOSIS — N185 Chronic kidney disease, stage 5: Secondary | ICD-10-CM | POA: Diagnosis not present

## 2021-04-09 DIAGNOSIS — R609 Edema, unspecified: Secondary | ICD-10-CM | POA: Diagnosis not present

## 2021-04-09 DIAGNOSIS — D631 Anemia in chronic kidney disease: Secondary | ICD-10-CM | POA: Diagnosis not present

## 2021-04-09 DIAGNOSIS — I120 Hypertensive chronic kidney disease with stage 5 chronic kidney disease or end stage renal disease: Secondary | ICD-10-CM | POA: Diagnosis not present

## 2021-04-09 DIAGNOSIS — N2581 Secondary hyperparathyroidism of renal origin: Secondary | ICD-10-CM | POA: Diagnosis not present

## 2021-04-24 DIAGNOSIS — I770 Arteriovenous fistula, acquired: Secondary | ICD-10-CM | POA: Diagnosis not present

## 2021-04-24 DIAGNOSIS — N185 Chronic kidney disease, stage 5: Secondary | ICD-10-CM | POA: Diagnosis not present

## 2021-04-24 DIAGNOSIS — R609 Edema, unspecified: Secondary | ICD-10-CM | POA: Diagnosis not present

## 2021-04-24 DIAGNOSIS — N2581 Secondary hyperparathyroidism of renal origin: Secondary | ICD-10-CM | POA: Diagnosis not present

## 2021-04-24 DIAGNOSIS — I120 Hypertensive chronic kidney disease with stage 5 chronic kidney disease or end stage renal disease: Secondary | ICD-10-CM | POA: Diagnosis not present

## 2021-04-24 DIAGNOSIS — D631 Anemia in chronic kidney disease: Secondary | ICD-10-CM | POA: Diagnosis not present

## 2021-04-24 DIAGNOSIS — E872 Acidosis: Secondary | ICD-10-CM | POA: Diagnosis not present

## 2021-05-21 ENCOUNTER — Other Ambulatory Visit: Payer: Self-pay | Admitting: Internal Medicine

## 2021-05-21 DIAGNOSIS — E039 Hypothyroidism, unspecified: Secondary | ICD-10-CM

## 2021-06-13 ENCOUNTER — Other Ambulatory Visit: Payer: Self-pay | Admitting: Internal Medicine

## 2021-06-13 DIAGNOSIS — E039 Hypothyroidism, unspecified: Secondary | ICD-10-CM

## 2021-06-15 ENCOUNTER — Other Ambulatory Visit: Payer: Self-pay | Admitting: Internal Medicine

## 2021-06-15 DIAGNOSIS — E039 Hypothyroidism, unspecified: Secondary | ICD-10-CM

## 2021-06-25 ENCOUNTER — Telehealth: Payer: Self-pay

## 2021-06-25 DIAGNOSIS — D631 Anemia in chronic kidney disease: Secondary | ICD-10-CM | POA: Diagnosis not present

## 2021-06-25 DIAGNOSIS — N2581 Secondary hyperparathyroidism of renal origin: Secondary | ICD-10-CM | POA: Diagnosis not present

## 2021-06-25 DIAGNOSIS — I120 Hypertensive chronic kidney disease with stage 5 chronic kidney disease or end stage renal disease: Secondary | ICD-10-CM | POA: Diagnosis not present

## 2021-06-25 DIAGNOSIS — I770 Arteriovenous fistula, acquired: Secondary | ICD-10-CM | POA: Diagnosis not present

## 2021-06-25 DIAGNOSIS — R609 Edema, unspecified: Secondary | ICD-10-CM | POA: Diagnosis not present

## 2021-06-25 DIAGNOSIS — E872 Acidosis: Secondary | ICD-10-CM | POA: Diagnosis not present

## 2021-06-25 DIAGNOSIS — N185 Chronic kidney disease, stage 5: Secondary | ICD-10-CM | POA: Diagnosis not present

## 2021-06-25 DIAGNOSIS — N189 Chronic kidney disease, unspecified: Secondary | ICD-10-CM | POA: Diagnosis not present

## 2021-06-25 NOTE — Telephone Encounter (Signed)
pts son called asking for a rx refill for pts Vitamin D3.

## 2021-06-28 LAB — BASIC METABOLIC PANEL
BUN: 37 — AB (ref 4–21)
CO2: 24 — AB (ref 13–22)
Chloride: 99 (ref 99–108)
Creatinine: 3.9 — AB (ref 0.5–1.1)
Glucose: 72
Potassium: 3.8 (ref 3.4–5.3)
Sodium: 136 — AB (ref 137–147)

## 2021-06-28 LAB — CBC AND DIFFERENTIAL
HCT: 35 — AB (ref 36–46)
Hemoglobin: 11.7 — AB (ref 12.0–16.0)
Platelets: 207 (ref 150–399)
WBC: 5.8

## 2021-06-28 LAB — IRON,TIBC AND FERRITIN PANEL
Ferritin: 51
Iron: 61

## 2021-06-28 LAB — COMPREHENSIVE METABOLIC PANEL
Albumin: 4.2 (ref 3.5–5.0)
Calcium: 9.1 (ref 8.7–10.7)

## 2021-07-03 ENCOUNTER — Ambulatory Visit (INDEPENDENT_AMBULATORY_CARE_PROVIDER_SITE_OTHER): Payer: Medicare Other | Admitting: Internal Medicine

## 2021-07-03 ENCOUNTER — Encounter: Payer: Self-pay | Admitting: Internal Medicine

## 2021-07-03 ENCOUNTER — Other Ambulatory Visit: Payer: Self-pay

## 2021-07-03 VITALS — BP 110/60 | HR 84 | Temp 98.2°F | Ht 61.0 in | Wt 97.0 lb

## 2021-07-03 DIAGNOSIS — K219 Gastro-esophageal reflux disease without esophagitis: Secondary | ICD-10-CM

## 2021-07-03 DIAGNOSIS — E039 Hypothyroidism, unspecified: Secondary | ICD-10-CM

## 2021-07-03 DIAGNOSIS — Z0001 Encounter for general adult medical examination with abnormal findings: Secondary | ICD-10-CM | POA: Diagnosis not present

## 2021-07-03 DIAGNOSIS — E559 Vitamin D deficiency, unspecified: Secondary | ICD-10-CM

## 2021-07-03 DIAGNOSIS — K745 Biliary cirrhosis, unspecified: Secondary | ICD-10-CM

## 2021-07-03 LAB — PROTIME-INR
INR: 0.9 ratio (ref 0.8–1.0)
Prothrombin Time: 10.5 s (ref 9.6–13.1)

## 2021-07-03 LAB — HEPATIC FUNCTION PANEL
ALT: 11 U/L (ref 0–35)
AST: 20 U/L (ref 0–37)
Albumin: 3.7 g/dL (ref 3.5–5.2)
Alkaline Phosphatase: 106 U/L (ref 39–117)
Bilirubin, Direct: 0.1 mg/dL (ref 0.0–0.3)
Total Bilirubin: 0.4 mg/dL (ref 0.2–1.2)
Total Protein: 7.7 g/dL (ref 6.0–8.3)

## 2021-07-03 LAB — TSH: TSH: 11.7 u[IU]/mL — ABNORMAL HIGH (ref 0.35–5.50)

## 2021-07-03 MED ORDER — URSODIOL 300 MG PO CAPS: 300.0000 mg | ORAL_CAPSULE | Freq: Three times a day (TID) | ORAL | 1 refills | Status: DC

## 2021-07-03 MED ORDER — ESOMEPRAZOLE MAGNESIUM 40 MG PO CPDR: 40.0000 mg | DELAYED_RELEASE_CAPSULE | Freq: Every day | ORAL | 1 refills | Status: DC

## 2021-07-03 MED ORDER — ALBUTEROL SULFATE HFA 108 (90 BASE) MCG/ACT IN AERS: 2.0000 | INHALATION_SPRAY | Freq: Four times a day (QID) | RESPIRATORY_TRACT | 2 refills | Status: DC | PRN

## 2021-07-03 MED ORDER — LEVOTHYROXINE SODIUM 75 MCG PO TABS: 75.0000 ug | ORAL_TABLET | Freq: Every day | ORAL | 1 refills | Status: DC

## 2021-07-03 NOTE — Assessment & Plan Note (Signed)
Exam completed Labs reviewed Vaccines are up-to-date No cancer screenings indicated Patient education was given 

## 2021-07-03 NOTE — Progress Notes (Signed)
Subjective:  Patient ID: Shari Schmitt, female    DOB: 1941-10-19  Age: 80 y.o. MRN: [id number]  CC: Annual Exam and Hypothyroidism  This visit occurred during the SARS-CoV-2 public health emergency.  Safety protocols were in place, including screening questions prior to the visit, additional usage of staff PPE, and extensive cleaning of exam room while observing appropriate contact time as indicated for disinfecting solutions.    HPI Shari Schmitt presents for a CPX and f/up -  She complains of lower extremity edema and tells me her nephrologist recently added a water pill.  She also complains of fatigue.  She denies constipation, palpitations, changes in her weight or appetite.  Outpatient Medications Prior to Visit  Medication Sig Dispense Refill   [employer] D3 50 MCG (2000 UT) CAPS TAKE 1 CAPSULE BY MOUTH EVERY DAY (Patient taking differently: Take 2,000 Units by mouth at bedtime.) 90 capsule 1   fluocinonide gel (LIDEX) 0.05 % APPLY TO AFFECTED AREA(S)  TOPICALLY TWO TIMES DAILY  AS NEEDED (Patient taking differently: Apply 1 application topically 2 (two) times daily as needed (rash).) 60 g 3   sodium bicarbonate 650 MG tablet Take 650 mg by mouth daily.     albuterol (PROAIR HFA) 108 (90 Base) MCG/ACT inhaler Inhale 2 puffs into the lungs every 6 (six) hours as needed. (Patient taking differently: Inhale 2 puffs into the lungs every 6 (six) hours as needed for wheezing or shortness of breath.) 3 Inhaler 2   esomeprazole (NEXIUM) 40 MG capsule TAKE 1 CAPSULE BY MOUTH EVERY DAY IN THE MORNING BEFORE BREAKFAST (Patient taking differently: Take 40 mg by mouth daily before breakfast.) 90 capsule 0   levothyroxine (SYNTHROID) 50 MCG tablet Take 1 tablet (50 mcg total) by mouth daily. 90 tablet 1   traMADol (ULTRAM) 50 MG tablet Take 1 tablet (50 mg total) by mouth 2 (two) times daily as needed. 8 tablet 0   ursodiol (ACTIGALL) 300 MG capsule TAKE 1 CAPSULE BY MOUTH 3  TIMES DAILY (Patient  taking differently: Take 300 mg by mouth 3 (three) times daily.) 270 capsule 1   No facility-administered medications prior to visit.    ROS Review of Systems  Constitutional:  Positive for fatigue. Negative for appetite change, diaphoresis and unexpected weight change.  HENT: Negative.    Eyes: Negative.   Respiratory:  Negative for cough, chest tightness, shortness of breath and wheezing.   Cardiovascular:  Negative for chest pain, palpitations and leg swelling.  Gastrointestinal:  Negative for abdominal pain, constipation, diarrhea and vomiting.  Endocrine: Negative for cold intolerance and heat intolerance.  Genitourinary: Negative.  Negative for difficulty urinating.  Musculoskeletal:  Negative for arthralgias, back pain, myalgias and neck pain.  Skin: Negative.  Negative for color change and pallor.  Neurological: Negative.  Negative for dizziness, weakness and light-headedness.  Hematological:  Negative for adenopathy. Does not bruise/bleed easily.  Psychiatric/Behavioral: Negative.     Objective:  BP 110/60 (BP Location: Right Arm, Patient Position: Sitting, Cuff Size: Normal)   Pulse 84   Temp 98.2 F (36.8 C) (Oral)   Ht 5\' 1"  (1.549 m)   Wt 97 lb (44 kg)   LMP  (LMP Unknown)   SpO2 99%   BMI 18.33 kg/m   BP Readings from Last 3 Encounters:  07/03/21 110/60  03/15/21 (!) 99/55  01/15/21 (!) 112/51    Wt Readings from Last 3 Encounters:  07/03/21 97 lb (44 kg)  03/15/21 101 lb 8 oz (46  kg)  01/15/21 104 lb (47.2 kg)    Physical Exam Vitals reviewed.  HENT:     Nose: Nose normal.     Mouth/Throat:     Mouth: Mucous membranes are moist.  Eyes:     Conjunctiva/sclera: Conjunctivae normal.  Cardiovascular:     Rate and Rhythm: Normal rate and regular rhythm.     Heart sounds: No murmur heard. Pulmonary:     Effort: Pulmonary effort is normal.     Breath sounds: No stridor. No wheezing, rhonchi or rales.  Abdominal:     General: Abdomen is flat. Bowel  sounds are normal. There is no distension.     Palpations: Abdomen is soft. There is no hepatomegaly, splenomegaly or mass.  Musculoskeletal:        General: Normal range of motion.     Cervical back: Neck supple.     Right lower leg: No edema.     Left lower leg: No edema.  Lymphadenopathy:     Cervical: No cervical adenopathy.  Skin:    General: Skin is warm and dry.  Neurological:     General: No focal deficit present.     Mental Status: She is alert.    Lab Results  Component Value Date   WBC 5.8 06/28/2021   HGB 11.7 (A) 06/28/2021   HCT 35 (A) 06/28/2021   PLT 207 06/28/2021   GLUCOSE 76 01/15/2021   CHOL 204 (H) 11/04/2019   TRIG 87.0 11/04/2019   HDL 82.20 11/04/2019   LDLCALC 104 (H) 11/04/2019   ALT 11 07/03/2021   AST 20 07/03/2021   NA 136 (A) 06/28/2021   K 3.8 06/28/2021   CL 99 06/28/2021   CREATININE 3.9 (A) 06/28/2021   BUN 37 (A) 06/28/2021   CO2 24 (A) 06/28/2021   TSH 11.70 (H) 07/03/2021   INR 0.9 07/03/2021    VAS US DUPLEX DIALYSIS ACCESS (AVF,AVG)  Result Date: 03/15/2021 DIALYSIS ACCESS Reason for Exam: Routine follow up. Access Site: Left Upper Extremity. Access Type: Brachial-cephalic AVF. History: Left BCAVF created on 01/15/21. Performing Technologist: Ralene Cork RVT  Examination Guidelines: A complete evaluation includes B-mode imaging, spectral Doppler, color Doppler, and power Doppler as needed of all accessible portions of each vessel. Unilateral testing is considered an integral part of a complete examination. Limited examinations for reoccurring indications may be performed as noted.  Findings: +--------------------+----------+-----------------+--------+ AVF                 PSV (cm/s)Flow Vol (mL/min)Comments +--------------------+----------+-----------------+--------+ Native artery inflow   261           833                +--------------------+----------+-----------------+--------+ AVF Anastomosis        736                               +--------------------+----------+-----------------+--------+  +------------+----------+-------------+----------+--------+ OUTFLOW VEINPSV (cm/s)Diameter (cm)Depth (cm)Describe +------------+----------+-------------+----------+--------+ Prox UA        109        0.64        0.54            +------------+----------+-------------+----------+--------+ Mid UA          85        0.60        0.33            +------------+----------+-------------+----------+--------+ Dist UA  68        0.71        0.24            +------------+----------+-------------+----------+--------+ AC Fossa       181        0.84        0.22            +------------+----------+-------------+----------+--------+   Summary: Patent left brachiocephalic AVF. No evidence of stenosis or thrombus. No significant branches noted.  *See table(s) above for measurements and observations.  Diagnosing physician: Ruta Hinds MD Electronically signed by Ruta Hinds MD on 03/15/2021 at 4:15:25 PM.    --------------------------------------------------------------------------------   Final     Assessment & Plan:   Shari Schmitt was seen today for annual exam and hypothyroidism.  Diagnoses and all orders for this visit:  Acquired hypothyroidism- Her TSH is above 10 and she is symptomatic.  I written recommended she increase the dose of levothyroxine. -     TSH; Future -     TSH -     levothyroxine (SYNTHROID) 75 MCG tablet; Take 1 tablet (75 mcg total) by mouth daily.  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 -     ursodiol (ACTIGALL) 300 MG capsule; Take 1 capsule (300 mg total) by mouth 3 (three) times daily. -     Hepatic function panel; Future -     Protime-INR; Future -     Protime-INR -     Hepatic function panel  Gastroesophageal reflux disease without esophagitis- she is doing well on the PPI. -     esomeprazole (NEXIUM) 40 MG capsule; Take 1 capsule (40 mg total) by mouth daily before  breakfast.  Vitamin D deficiency  Other orders -     albuterol (PROAIR HFA) 108 (90 Base) MCG/ACT inhaler; Inhale 2 puffs into the lungs every 6 (six) hours as needed for wheezing or shortness of breath.  I have discontinued Shari Schmitt's levothyroxine and traMADol. I have also changed her ursodiol, esomeprazole, and albuterol. Additionally, I am having her start on levothyroxine. Lastly, I am having her maintain her fluocinonide gel, CVS D3, and sodium bicarbonate.  Meds ordered this encounter  Medications   ursodiol (ACTIGALL) 300 MG capsule    Sig: Take 1 capsule (300 mg total) by mouth 3 (three) times daily.    Dispense:  270 capsule    Refill:  1   esomeprazole (NEXIUM) 40 MG capsule    Sig: Take 1 capsule (40 mg total) by mouth daily before breakfast.    Dispense:  90 capsule    Refill:  1   albuterol (PROAIR HFA) 108 (90 Base) MCG/ACT inhaler    Sig: Inhale 2 puffs into the lungs every 6 (six) hours as needed for wheezing or shortness of breath.    Dispense:  3 each    Refill:  2   levothyroxine (SYNTHROID) 75 MCG tablet    Sig: Take 1 tablet (75 mcg total) by mouth daily.    Dispense:  90 tablet    Refill:  1     Follow-up: Return in about 6 months (around 01/03/2022).  Scarlette Calico, MD

## 2021-07-03 NOTE — Patient Instructions (Signed)

## 2021-07-04 ENCOUNTER — Encounter: Payer: Self-pay | Admitting: Internal Medicine

## 2021-08-07 ENCOUNTER — Telehealth: Payer: Self-pay | Admitting: Internal Medicine

## 2021-08-07 NOTE — Telephone Encounter (Signed)
1.Medication Requested: CVS D3 50 MCG (2000 UT) CAPS  2. Pharmacy (Name, Street, [place]): [id number] - [place], [place]  Phone:  [phone] Fax:  [phone]   3. On Med List: yes  4. Last Visit with PCP: 08.09.22  5. Next visit date with PCP: n/a   Agent: Please be advised that RX refills may take up to 3 business days. We ask that you follow-up with your pharmacy.

## 2021-08-11 ENCOUNTER — Other Ambulatory Visit: Payer: Self-pay | Admitting: Internal Medicine

## 2021-08-11 DIAGNOSIS — E559 Vitamin D deficiency, unspecified: Secondary | ICD-10-CM

## 2021-08-11 MED ORDER — CVS D3 50 MCG (2000 UT) PO CAPS: 2000.0000 [IU] | ORAL_CAPSULE | Freq: Every day | ORAL | 0 refills | Status: DC

## 2021-08-14 ENCOUNTER — Other Ambulatory Visit: Payer: Self-pay | Admitting: Internal Medicine

## 2021-08-14 DIAGNOSIS — K745 Biliary cirrhosis, unspecified: Secondary | ICD-10-CM

## 2021-08-23 ENCOUNTER — Other Ambulatory Visit: Payer: Self-pay | Admitting: Internal Medicine

## 2021-08-23 DIAGNOSIS — E039 Hypothyroidism, unspecified: Secondary | ICD-10-CM

## 2021-09-06 ENCOUNTER — Other Ambulatory Visit: Payer: Self-pay | Admitting: Internal Medicine

## 2021-09-06 DIAGNOSIS — E559 Vitamin D deficiency, unspecified: Secondary | ICD-10-CM

## 2021-09-10 ENCOUNTER — Other Ambulatory Visit: Payer: Self-pay | Admitting: Internal Medicine

## 2021-09-10 DIAGNOSIS — E039 Hypothyroidism, unspecified: Secondary | ICD-10-CM

## 2021-09-19 DIAGNOSIS — N185 Chronic kidney disease, stage 5: Secondary | ICD-10-CM | POA: Diagnosis not present

## 2021-09-25 DIAGNOSIS — E872 Acidosis, unspecified: Secondary | ICD-10-CM | POA: Diagnosis not present

## 2021-09-25 DIAGNOSIS — I770 Arteriovenous fistula, acquired: Secondary | ICD-10-CM | POA: Diagnosis not present

## 2021-09-25 DIAGNOSIS — N2581 Secondary hyperparathyroidism of renal origin: Secondary | ICD-10-CM | POA: Diagnosis not present

## 2021-09-25 DIAGNOSIS — D631 Anemia in chronic kidney disease: Secondary | ICD-10-CM | POA: Diagnosis not present

## 2021-09-25 DIAGNOSIS — N185 Chronic kidney disease, stage 5: Secondary | ICD-10-CM | POA: Diagnosis not present

## 2021-09-25 DIAGNOSIS — R609 Edema, unspecified: Secondary | ICD-10-CM | POA: Diagnosis not present

## 2021-09-25 DIAGNOSIS — I120 Hypertensive chronic kidney disease with stage 5 chronic kidney disease or end stage renal disease: Secondary | ICD-10-CM | POA: Diagnosis not present

## 2021-09-25 DIAGNOSIS — Z23 Encounter for immunization: Secondary | ICD-10-CM | POA: Diagnosis not present

## 2021-09-29 ENCOUNTER — Other Ambulatory Visit: Payer: Self-pay | Admitting: Internal Medicine

## 2021-09-29 DIAGNOSIS — E559 Vitamin D deficiency, unspecified: Secondary | ICD-10-CM

## 2021-10-15 ENCOUNTER — Other Ambulatory Visit: Payer: Self-pay | Admitting: Internal Medicine

## 2021-10-15 DIAGNOSIS — E039 Hypothyroidism, unspecified: Secondary | ICD-10-CM

## 2021-10-16 DIAGNOSIS — N185 Chronic kidney disease, stage 5: Secondary | ICD-10-CM | POA: Diagnosis not present

## 2021-10-23 DIAGNOSIS — N2581 Secondary hyperparathyroidism of renal origin: Secondary | ICD-10-CM | POA: Diagnosis not present

## 2021-10-23 DIAGNOSIS — E872 Acidosis, unspecified: Secondary | ICD-10-CM | POA: Diagnosis not present

## 2021-10-23 DIAGNOSIS — D631 Anemia in chronic kidney disease: Secondary | ICD-10-CM | POA: Diagnosis not present

## 2021-10-23 DIAGNOSIS — I770 Arteriovenous fistula, acquired: Secondary | ICD-10-CM | POA: Diagnosis not present

## 2021-10-23 DIAGNOSIS — I120 Hypertensive chronic kidney disease with stage 5 chronic kidney disease or end stage renal disease: Secondary | ICD-10-CM | POA: Diagnosis not present

## 2021-10-23 DIAGNOSIS — N185 Chronic kidney disease, stage 5: Secondary | ICD-10-CM | POA: Diagnosis not present

## 2021-11-26 ENCOUNTER — Other Ambulatory Visit: Payer: Self-pay | Admitting: Internal Medicine

## 2021-11-26 DIAGNOSIS — E559 Vitamin D deficiency, unspecified: Secondary | ICD-10-CM

## 2021-11-28 DIAGNOSIS — N185 Chronic kidney disease, stage 5: Secondary | ICD-10-CM | POA: Diagnosis not present

## 2021-12-05 DIAGNOSIS — I770 Arteriovenous fistula, acquired: Secondary | ICD-10-CM | POA: Diagnosis not present

## 2021-12-05 DIAGNOSIS — E872 Acidosis, unspecified: Secondary | ICD-10-CM | POA: Diagnosis not present

## 2021-12-05 DIAGNOSIS — I120 Hypertensive chronic kidney disease with stage 5 chronic kidney disease or end stage renal disease: Secondary | ICD-10-CM | POA: Diagnosis not present

## 2021-12-05 DIAGNOSIS — D631 Anemia in chronic kidney disease: Secondary | ICD-10-CM | POA: Diagnosis not present

## 2021-12-05 DIAGNOSIS — N185 Chronic kidney disease, stage 5: Secondary | ICD-10-CM | POA: Diagnosis not present

## 2021-12-05 DIAGNOSIS — N2581 Secondary hyperparathyroidism of renal origin: Secondary | ICD-10-CM | POA: Diagnosis not present

## 2021-12-27 ENCOUNTER — Other Ambulatory Visit: Payer: Self-pay | Admitting: Internal Medicine

## 2021-12-27 DIAGNOSIS — K219 Gastro-esophageal reflux disease without esophagitis: Secondary | ICD-10-CM

## 2022-01-30 DIAGNOSIS — N390 Urinary tract infection, site not specified: Secondary | ICD-10-CM | POA: Diagnosis not present

## 2022-01-30 DIAGNOSIS — N185 Chronic kidney disease, stage 5: Secondary | ICD-10-CM | POA: Diagnosis not present

## 2022-02-05 DIAGNOSIS — N185 Chronic kidney disease, stage 5: Secondary | ICD-10-CM | POA: Diagnosis not present

## 2022-02-05 DIAGNOSIS — I770 Arteriovenous fistula, acquired: Secondary | ICD-10-CM | POA: Diagnosis not present

## 2022-02-05 DIAGNOSIS — I120 Hypertensive chronic kidney disease with stage 5 chronic kidney disease or end stage renal disease: Secondary | ICD-10-CM | POA: Diagnosis not present

## 2022-02-05 DIAGNOSIS — E872 Acidosis, unspecified: Secondary | ICD-10-CM | POA: Diagnosis not present

## 2022-02-05 DIAGNOSIS — D631 Anemia in chronic kidney disease: Secondary | ICD-10-CM | POA: Diagnosis not present

## 2022-02-05 DIAGNOSIS — N2581 Secondary hyperparathyroidism of renal origin: Secondary | ICD-10-CM | POA: Diagnosis not present

## 2022-02-05 DIAGNOSIS — N390 Urinary tract infection, site not specified: Secondary | ICD-10-CM | POA: Diagnosis not present

## 2022-02-08 LAB — BASIC METABOLIC PANEL
BUN: 31 — AB (ref 4–21)
CO2: 17 (ref 13–22)
Chloride: 103 (ref 99–108)
Creatinine: 3.7 — AB (ref 0.5–1.1)
Glucose: 80
Potassium: 4.2 mEq/L (ref 3.5–5.1)
Sodium: 136 — AB (ref 137–147)

## 2022-02-08 LAB — CBC AND DIFFERENTIAL
HCT: 32 — AB (ref 36–46)
Hemoglobin: 10.9 — AB (ref 12.0–16.0)
Platelets: 258 10*3/uL (ref 150–400)
WBC: 7.4

## 2022-02-08 LAB — COMPREHENSIVE METABOLIC PANEL
Albumin: 4 (ref 3.5–5.0)
Calcium: 8.7 (ref 8.7–10.7)
eGFR: 12

## 2022-02-08 LAB — HEPATIC FUNCTION PANEL
ALT: 9 U/L (ref 7–35)
AST: 16 (ref 13–35)
Alkaline Phosphatase: 157 — AB (ref 25–125)
Bilirubin, Total: 0.4

## 2022-02-08 LAB — IRON,TIBC AND FERRITIN PANEL
Ferritin: 95
Iron: 75

## 2022-02-12 ENCOUNTER — Encounter: Payer: Self-pay | Admitting: Internal Medicine

## 2022-02-12 ENCOUNTER — Ambulatory Visit (INDEPENDENT_AMBULATORY_CARE_PROVIDER_SITE_OTHER): Payer: Medicare Other | Admitting: Internal Medicine

## 2022-02-12 ENCOUNTER — Other Ambulatory Visit: Payer: Self-pay

## 2022-02-12 VITALS — BP 126/60 | HR 72 | Temp 97.9°F | Resp 16 | Ht 61.0 in | Wt 100.0 lb

## 2022-02-12 DIAGNOSIS — Z23 Encounter for immunization: Secondary | ICD-10-CM | POA: Diagnosis not present

## 2022-02-12 DIAGNOSIS — E039 Hypothyroidism, unspecified: Secondary | ICD-10-CM

## 2022-02-12 DIAGNOSIS — R011 Cardiac murmur, unspecified: Secondary | ICD-10-CM | POA: Diagnosis not present

## 2022-02-12 LAB — TSH: TSH: 0.86 u[IU]/mL (ref 0.35–5.50)

## 2022-02-12 MED ORDER — SHINGRIX 50 MCG/0.5ML IM SUSR: 0.5000 mL | Freq: Once | INTRAMUSCULAR | 1 refills | Status: AC

## 2022-02-12 NOTE — Patient Instructions (Signed)

## 2022-02-12 NOTE — Progress Notes (Signed)
? ?Subjective:  ?Patient ID: Shari Schmitt, female    DOB: Aug 09, 1941  Age: 81 y.o. MRN: [id number] ? ?CC: Hypothyroidism ? ?This visit occurred during the SARS-CoV-2 public health emergency.  Safety protocols were in place, including screening questions prior to the visit, additional usage of staff PPE, and extensive cleaning of exam room while observing appropriate contact time as indicated for disinfecting solutions.   ? ?HPI ?Shari Schmitt presents for f/up - ? ?She feels well. Has gained a few pounds and has a good appetite. ? ?Outpatient Medications Prior to Visit  ?Medication Sig Dispense Refill  ? albuterol (PROAIR HFA) 108 (90 Base) MCG/ACT inhaler Inhale 2 puffs into the lungs every 6 (six) hours as needed for wheezing or shortness of breath. 3 each 2  ? Cholecalciferol (VITAMIN D3) 50 MCG (2000 UT) capsule TAKE 1 CAPSULE BY MOUTH EVERY DAY 60 capsule 1  ? esomeprazole (NEXIUM) 40 MG capsule TAKE 1 CAPSULE BY MOUTH EVERY DAY BEFORE BREAKFAST 90 capsule 1  ? fluocinonide gel (LIDEX) 0.05 % APPLY TO AFFECTED AREA(S)  TOPICALLY TWO TIMES DAILY  AS NEEDED (Patient taking differently: Apply 1 application. topically 2 (two) times daily as needed (rash).) 60 g 3  ? levothyroxine (SYNTHROID) 75 MCG tablet TAKE 1 TABLET BY MOUTH EVERY DAY 90 tablet 1  ? sodium bicarbonate 650 MG tablet Take 650 mg by mouth daily.    ? ursodiol (ACTIGALL) 300 MG capsule TAKE 1 CAPSULE BY MOUTH 3  TIMES DAILY 270 capsule 0  ? ?No facility-administered medications prior to visit.  ? ? ?ROS ?Review of Systems  ?Constitutional:  Negative for diaphoresis, fatigue and unexpected weight change.  ?HENT: Negative.    ?Eyes: Negative.   ?Respiratory:  Negative for cough, chest tightness, shortness of breath and wheezing.   ?Cardiovascular:  Negative for chest pain, palpitations and leg swelling.  ?Gastrointestinal:  Negative for abdominal pain, constipation, diarrhea and nausea.  ?Endocrine: Negative.   ?Genitourinary: Negative.  Negative  for difficulty urinating.  ?Musculoskeletal: Negative.   ?Skin: Negative.   ?Neurological: Negative.  Negative for dizziness and weakness.  ?Hematological:  Negative for adenopathy. Does not bruise/bleed easily.  ?Psychiatric/Behavioral: Negative.    ? ?Objective:  ?BP 126/60 (BP Location: Right Arm, Patient Position: Sitting, Cuff Size: Normal)   Pulse 72   Temp 97.9 ?F (36.6 ?C) (Oral)   Resp 16   Ht '5\' 1"'$  (1.549 m)   Wt 100 lb (45.4 kg)   LMP  (LMP Unknown)   SpO2 98%   BMI 18.89 kg/m?  ? ?BP Readings from Last 3 Encounters:  ?02/12/22 126/60  ?07/03/21 110/60  ?03/15/21 (!) 99/55  ? ? ?Wt Readings from Last 3 Encounters:  ?02/12/22 100 lb (45.4 kg)  ?07/03/21 97 lb (44 kg)  ?03/15/21 101 lb 8 oz (46 kg)  ? ? ?Physical Exam ?Vitals reviewed.  ?HENT:  ?   Nose: Nose normal.  ?   Mouth/Throat:  ?   Mouth: Mucous membranes are moist.  ?Eyes:  ?   General: No scleral icterus. ?   Conjunctiva/sclera: Conjunctivae normal.  ?Cardiovascular:  ?   Rate and Rhythm: Normal rate and regular rhythm.  ?   Heart sounds: Murmur heard.  ?Systolic murmur is present with a grade of 3/6.  ?No diastolic murmur is present.  ?  No gallop.  ?   Comments: 3/6 SEM loudest over LUSB - heard everywhere ?Pulmonary:  ?   Effort: Pulmonary effort is normal.  ?  Breath sounds: No stridor. No wheezing, rhonchi or rales.  ?Abdominal:  ?   General: Abdomen is flat. There is no distension.  ?   Palpations: There is no mass.  ?   Tenderness: There is no abdominal tenderness. There is no guarding.  ?   Hernia: No hernia is present.  ?Musculoskeletal:  ?   Cervical back: Neck supple.  ?   Right lower leg: No edema.  ?   Left lower leg: No edema.  ?Lymphadenopathy:  ?   Cervical: No cervical adenopathy.  ?Skin: ?   General: Skin is warm and dry.  ?   Findings: No rash.  ?Neurological:  ?   General: No focal deficit present.  ?   Mental Status: She is alert.  ?Psychiatric:     ?   Mood and Affect: Mood normal.     ?   Behavior: Behavior normal.   ? ? ?Lab Results  ?Component Value Date  ? WBC 7.4 02/08/2022  ? HGB 10.9 (A) 02/08/2022  ? HCT 32 (A) 02/08/2022  ? PLT 258 02/08/2022  ? GLUCOSE 76 01/15/2021  ? CHOL 204 (H) 11/04/2019  ? TRIG 87.0 11/04/2019  ? HDL 82.20 11/04/2019  ? LDLCALC 104 (H) 11/04/2019  ? ALT 9 02/08/2022  ? AST 16 02/08/2022  ? NA 136 (A) 02/08/2022  ? K 4.2 02/08/2022  ? CL 103 02/08/2022  ? CREATININE 3.7 (A) 02/08/2022  ? BUN 31 (A) 02/08/2022  ? CO2 17 02/08/2022  ? TSH 0.86 02/12/2022  ? INR 0.9 07/03/2021  ? ? ?VAS US DUPLEX DIALYSIS ACCESS (AVF,AVG) ? ?Result Date: 03/15/2021 ?DIALYSIS ACCESS Reason for Exam: Routine follow up. Access Site: Left Upper Extremity. Access Type: Brachial-cephalic AVF. History: Left BCAVF created on 01/15/21. Performing Technologist: Ralene Cork RVT  Examination Guidelines: A complete evaluation includes B-mode imaging, spectral Doppler, color Doppler, and power Doppler as needed of all accessible portions of each vessel. Unilateral testing is considered an integral part of a complete examination. Limited examinations for reoccurring indications may be performed as noted.  Findings: +--------------------+----------+-----------------+--------+ AVF                 PSV (cm/s)Flow Vol (mL/min)Comments +--------------------+----------+-----------------+--------+ Native artery inflow   261           833                +--------------------+----------+-----------------+--------+ AVF Anastomosis        736                              +--------------------+----------+-----------------+--------+  +------------+----------+-------------+----------+--------+ OUTFLOW VEINPSV (cm/s)Diameter (cm)Depth (cm)Describe +------------+----------+-------------+----------+--------+ Prox UA        109        0.64        0.54            +------------+----------+-------------+----------+--------+ Mid UA          85        0.60        0.33             +------------+----------+-------------+----------+--------+ Dist UA         68        0.71        0.24            +------------+----------+-------------+----------+--------+ AC Fossa       181        0.84  0.22            +------------+----------+-------------+----------+--------+   Summary: Patent left brachiocephalic AVF. No evidence of stenosis or thrombus. No significant branches noted.  *See table(s) above for measurements and observations.  Diagnosing physician: Ruta Hinds MD Electronically signed by Ruta Hinds MD on 03/15/2021 at 4:15:25 PM.    --------------------------------------------------------------------------------   Final   ? ? ?Assessment & Plan:  ? ?Ahsley was seen today for hypothyroidism. ? ?Diagnoses and all orders for this visit: ? ?Grade 3 out of 6 intensity murmur- Will evaluate for valvular heart disease. ?-     ECHOCARDIOGRAM COMPLETE; Future ? ?Acquired hypothyroidism- Her TSH is in the normal range. Will continue the current T4 dose. ?-     TSH; Future ?-     TSH ? ?Need for shingles vaccine ?-     Zoster Vaccine Adjuvanted [facility]) injection; Inject 0.5 mLs into the muscle once for 1 dose. ? ?Other orders ?-     Pneumococcal polysaccharide vaccine 23-valent greater than or equal to 2yo subcutaneous/IM ? ? ?I am having Shari Schmitt start on Shingrix. I am also having her maintain her fluocinonide gel, sodium bicarbonate, albuterol, ursodiol, levothyroxine, Vitamin D3, and esomeprazole. ? ?Meds ordered this encounter  ?Medications  ? Zoster Vaccine Adjuvanted [facility]) injection  ?  Sig: Inject 0.5 mLs into the muscle once for 1 dose.  ?  Dispense:  0.5 mL  ?  Refill:  1  ? ? ? ?Follow-up: Return in about 6 months (around 08/15/2022). ? ?Scarlette Calico, MD ?

## 2022-02-13 ENCOUNTER — Encounter: Payer: Self-pay | Admitting: Internal Medicine

## 2022-03-01 ENCOUNTER — Other Ambulatory Visit: Payer: Self-pay | Admitting: Internal Medicine

## 2022-03-01 DIAGNOSIS — K745 Biliary cirrhosis, unspecified: Secondary | ICD-10-CM

## 2022-03-13 DIAGNOSIS — N185 Chronic kidney disease, stage 5: Secondary | ICD-10-CM | POA: Diagnosis not present

## 2022-03-20 DIAGNOSIS — N2581 Secondary hyperparathyroidism of renal origin: Secondary | ICD-10-CM | POA: Diagnosis not present

## 2022-03-20 DIAGNOSIS — N185 Chronic kidney disease, stage 5: Secondary | ICD-10-CM | POA: Diagnosis not present

## 2022-03-20 DIAGNOSIS — D631 Anemia in chronic kidney disease: Secondary | ICD-10-CM | POA: Diagnosis not present

## 2022-03-20 DIAGNOSIS — I120 Hypertensive chronic kidney disease with stage 5 chronic kidney disease or end stage renal disease: Secondary | ICD-10-CM | POA: Diagnosis not present

## 2022-03-20 DIAGNOSIS — I770 Arteriovenous fistula, acquired: Secondary | ICD-10-CM | POA: Diagnosis not present

## 2022-03-20 DIAGNOSIS — E872 Acidosis, unspecified: Secondary | ICD-10-CM | POA: Diagnosis not present

## 2022-04-01 ENCOUNTER — Telehealth (HOSPITAL_BASED_OUTPATIENT_CLINIC_OR_DEPARTMENT_OTHER): Payer: Self-pay | Admitting: *Deleted

## 2022-04-05 ENCOUNTER — Other Ambulatory Visit: Payer: Self-pay | Admitting: Internal Medicine

## 2022-04-26 ENCOUNTER — Encounter (HOSPITAL_BASED_OUTPATIENT_CLINIC_OR_DEPARTMENT_OTHER): Payer: Self-pay | Admitting: *Deleted

## 2022-04-30 DIAGNOSIS — N185 Chronic kidney disease, stage 5: Secondary | ICD-10-CM | POA: Diagnosis not present

## 2022-05-09 DIAGNOSIS — I120 Hypertensive chronic kidney disease with stage 5 chronic kidney disease or end stage renal disease: Secondary | ICD-10-CM | POA: Diagnosis not present

## 2022-05-09 DIAGNOSIS — E872 Acidosis, unspecified: Secondary | ICD-10-CM | POA: Diagnosis not present

## 2022-05-09 DIAGNOSIS — I770 Arteriovenous fistula, acquired: Secondary | ICD-10-CM | POA: Diagnosis not present

## 2022-05-09 DIAGNOSIS — D631 Anemia in chronic kidney disease: Secondary | ICD-10-CM | POA: Diagnosis not present

## 2022-05-09 DIAGNOSIS — N2581 Secondary hyperparathyroidism of renal origin: Secondary | ICD-10-CM | POA: Diagnosis not present

## 2022-05-09 DIAGNOSIS — N185 Chronic kidney disease, stage 5: Secondary | ICD-10-CM | POA: Diagnosis not present

## 2022-05-14 ENCOUNTER — Other Ambulatory Visit: Payer: Self-pay | Admitting: Internal Medicine

## 2022-05-14 DIAGNOSIS — E039 Hypothyroidism, unspecified: Secondary | ICD-10-CM

## 2022-05-14 DIAGNOSIS — K219 Gastro-esophageal reflux disease without esophagitis: Secondary | ICD-10-CM

## 2022-05-16 NOTE — Telephone Encounter (Signed)
Opened in error

## 2022-05-29 ENCOUNTER — Telehealth (HOSPITAL_BASED_OUTPATIENT_CLINIC_OR_DEPARTMENT_OTHER): Payer: Self-pay | Admitting: *Deleted

## 2022-05-29 NOTE — Telephone Encounter (Signed)
Patient has not responded to our efforts to schedule the Echocardiogram that was ordered 4/21/2---Patient was called 03/26/22--03/28/22--04/01/22  04/05/22 and letter was mail requesting she call to discuss scheduling.. For the se reasons, the order will be cancelled.

## 2022-06-19 DIAGNOSIS — N185 Chronic kidney disease, stage 5: Secondary | ICD-10-CM | POA: Diagnosis not present

## 2022-06-24 DIAGNOSIS — D631 Anemia in chronic kidney disease: Secondary | ICD-10-CM | POA: Diagnosis not present

## 2022-06-24 DIAGNOSIS — N2581 Secondary hyperparathyroidism of renal origin: Secondary | ICD-10-CM | POA: Diagnosis not present

## 2022-06-24 DIAGNOSIS — N185 Chronic kidney disease, stage 5: Secondary | ICD-10-CM | POA: Diagnosis not present

## 2022-06-24 DIAGNOSIS — R609 Edema, unspecified: Secondary | ICD-10-CM | POA: Diagnosis not present

## 2022-06-24 DIAGNOSIS — I770 Arteriovenous fistula, acquired: Secondary | ICD-10-CM | POA: Diagnosis not present

## 2022-06-24 DIAGNOSIS — I120 Hypertensive chronic kidney disease with stage 5 chronic kidney disease or end stage renal disease: Secondary | ICD-10-CM | POA: Diagnosis not present

## 2022-06-24 DIAGNOSIS — E872 Acidosis, unspecified: Secondary | ICD-10-CM | POA: Diagnosis not present

## 2022-08-07 DIAGNOSIS — N185 Chronic kidney disease, stage 5: Secondary | ICD-10-CM | POA: Diagnosis not present

## 2022-08-12 DIAGNOSIS — I120 Hypertensive chronic kidney disease with stage 5 chronic kidney disease or end stage renal disease: Secondary | ICD-10-CM | POA: Diagnosis not present

## 2022-08-12 DIAGNOSIS — E872 Acidosis, unspecified: Secondary | ICD-10-CM | POA: Diagnosis not present

## 2022-08-12 DIAGNOSIS — D631 Anemia in chronic kidney disease: Secondary | ICD-10-CM | POA: Diagnosis not present

## 2022-08-12 DIAGNOSIS — N185 Chronic kidney disease, stage 5: Secondary | ICD-10-CM | POA: Diagnosis not present

## 2022-08-12 DIAGNOSIS — I770 Arteriovenous fistula, acquired: Secondary | ICD-10-CM | POA: Diagnosis not present

## 2022-08-12 DIAGNOSIS — R609 Edema, unspecified: Secondary | ICD-10-CM | POA: Diagnosis not present

## 2022-08-12 DIAGNOSIS — N2581 Secondary hyperparathyroidism of renal origin: Secondary | ICD-10-CM | POA: Diagnosis not present

## 2022-08-12 DIAGNOSIS — Z23 Encounter for immunization: Secondary | ICD-10-CM | POA: Diagnosis not present

## 2022-09-17 DIAGNOSIS — N185 Chronic kidney disease, stage 5: Secondary | ICD-10-CM | POA: Diagnosis not present

## 2022-09-18 DIAGNOSIS — N185 Chronic kidney disease, stage 5: Secondary | ICD-10-CM | POA: Diagnosis not present

## 2022-09-18 DIAGNOSIS — N390 Urinary tract infection, site not specified: Secondary | ICD-10-CM | POA: Diagnosis not present

## 2022-09-25 DIAGNOSIS — N390 Urinary tract infection, site not specified: Secondary | ICD-10-CM | POA: Diagnosis not present

## 2022-09-25 DIAGNOSIS — N2581 Secondary hyperparathyroidism of renal origin: Secondary | ICD-10-CM | POA: Diagnosis not present

## 2022-09-25 DIAGNOSIS — N185 Chronic kidney disease, stage 5: Secondary | ICD-10-CM | POA: Diagnosis not present

## 2022-09-25 DIAGNOSIS — I770 Arteriovenous fistula, acquired: Secondary | ICD-10-CM | POA: Diagnosis not present

## 2022-09-25 DIAGNOSIS — E872 Acidosis, unspecified: Secondary | ICD-10-CM | POA: Diagnosis not present

## 2022-09-25 DIAGNOSIS — I120 Hypertensive chronic kidney disease with stage 5 chronic kidney disease or end stage renal disease: Secondary | ICD-10-CM | POA: Diagnosis not present

## 2022-09-25 DIAGNOSIS — D631 Anemia in chronic kidney disease: Secondary | ICD-10-CM | POA: Diagnosis not present

## 2022-10-22 DIAGNOSIS — N185 Chronic kidney disease, stage 5: Secondary | ICD-10-CM | POA: Diagnosis not present

## 2022-10-30 DIAGNOSIS — I770 Arteriovenous fistula, acquired: Secondary | ICD-10-CM | POA: Diagnosis not present

## 2022-10-30 DIAGNOSIS — N185 Chronic kidney disease, stage 5: Secondary | ICD-10-CM | POA: Diagnosis not present

## 2022-10-30 DIAGNOSIS — I120 Hypertensive chronic kidney disease with stage 5 chronic kidney disease or end stage renal disease: Secondary | ICD-10-CM | POA: Diagnosis not present

## 2022-10-30 DIAGNOSIS — D631 Anemia in chronic kidney disease: Secondary | ICD-10-CM | POA: Diagnosis not present

## 2022-10-30 DIAGNOSIS — N2581 Secondary hyperparathyroidism of renal origin: Secondary | ICD-10-CM | POA: Diagnosis not present

## 2022-10-30 DIAGNOSIS — E872 Acidosis, unspecified: Secondary | ICD-10-CM | POA: Diagnosis not present

## 2022-11-20 DIAGNOSIS — N185 Chronic kidney disease, stage 5: Secondary | ICD-10-CM | POA: Diagnosis not present

## 2022-11-27 DIAGNOSIS — E872 Acidosis, unspecified: Secondary | ICD-10-CM | POA: Diagnosis not present

## 2022-11-27 DIAGNOSIS — I770 Arteriovenous fistula, acquired: Secondary | ICD-10-CM | POA: Diagnosis not present

## 2022-11-27 DIAGNOSIS — D631 Anemia in chronic kidney disease: Secondary | ICD-10-CM | POA: Diagnosis not present

## 2022-11-27 DIAGNOSIS — I120 Hypertensive chronic kidney disease with stage 5 chronic kidney disease or end stage renal disease: Secondary | ICD-10-CM | POA: Diagnosis not present

## 2022-11-27 DIAGNOSIS — N390 Urinary tract infection, site not specified: Secondary | ICD-10-CM | POA: Diagnosis not present

## 2022-11-27 DIAGNOSIS — N185 Chronic kidney disease, stage 5: Secondary | ICD-10-CM | POA: Diagnosis not present

## 2022-11-27 DIAGNOSIS — N2581 Secondary hyperparathyroidism of renal origin: Secondary | ICD-10-CM | POA: Diagnosis not present

## 2022-12-04 DIAGNOSIS — N185 Chronic kidney disease, stage 5: Secondary | ICD-10-CM | POA: Diagnosis not present

## 2022-12-25 DIAGNOSIS — N185 Chronic kidney disease, stage 5: Secondary | ICD-10-CM | POA: Diagnosis not present

## 2022-12-25 DIAGNOSIS — N390 Urinary tract infection, site not specified: Secondary | ICD-10-CM | POA: Diagnosis not present

## 2022-12-25 DIAGNOSIS — N189 Chronic kidney disease, unspecified: Secondary | ICD-10-CM | POA: Diagnosis not present

## 2022-12-31 DIAGNOSIS — N250 Renal osteodystrophy: Secondary | ICD-10-CM | POA: Diagnosis not present

## 2022-12-31 DIAGNOSIS — I770 Arteriovenous fistula, acquired: Secondary | ICD-10-CM | POA: Diagnosis not present

## 2022-12-31 DIAGNOSIS — N185 Chronic kidney disease, stage 5: Secondary | ICD-10-CM | POA: Diagnosis not present

## 2022-12-31 DIAGNOSIS — D631 Anemia in chronic kidney disease: Secondary | ICD-10-CM | POA: Diagnosis not present

## 2022-12-31 DIAGNOSIS — I120 Hypertensive chronic kidney disease with stage 5 chronic kidney disease or end stage renal disease: Secondary | ICD-10-CM | POA: Diagnosis not present

## 2022-12-31 DIAGNOSIS — E872 Acidosis, unspecified: Secondary | ICD-10-CM | POA: Diagnosis not present

## 2023-01-24 DIAGNOSIS — N185 Chronic kidney disease, stage 5: Secondary | ICD-10-CM | POA: Diagnosis not present

## 2023-01-29 DIAGNOSIS — N2581 Secondary hyperparathyroidism of renal origin: Secondary | ICD-10-CM | POA: Diagnosis not present

## 2023-01-29 DIAGNOSIS — I120 Hypertensive chronic kidney disease with stage 5 chronic kidney disease or end stage renal disease: Secondary | ICD-10-CM | POA: Diagnosis not present

## 2023-01-29 DIAGNOSIS — I770 Arteriovenous fistula, acquired: Secondary | ICD-10-CM | POA: Diagnosis not present

## 2023-01-29 DIAGNOSIS — E872 Acidosis, unspecified: Secondary | ICD-10-CM | POA: Diagnosis not present

## 2023-01-29 DIAGNOSIS — D631 Anemia in chronic kidney disease: Secondary | ICD-10-CM | POA: Diagnosis not present

## 2023-01-29 DIAGNOSIS — N185 Chronic kidney disease, stage 5: Secondary | ICD-10-CM | POA: Diagnosis not present

## 2023-01-29 LAB — LAB REPORT - SCANNED: EGFR: 13

## 2023-01-31 ENCOUNTER — Other Ambulatory Visit: Payer: Self-pay | Admitting: Internal Medicine

## 2023-01-31 DIAGNOSIS — K745 Biliary cirrhosis, unspecified: Secondary | ICD-10-CM

## 2023-03-05 DIAGNOSIS — N185 Chronic kidney disease, stage 5: Secondary | ICD-10-CM | POA: Diagnosis not present

## 2023-03-12 DIAGNOSIS — I120 Hypertensive chronic kidney disease with stage 5 chronic kidney disease or end stage renal disease: Secondary | ICD-10-CM | POA: Diagnosis not present

## 2023-03-12 DIAGNOSIS — I770 Arteriovenous fistula, acquired: Secondary | ICD-10-CM | POA: Diagnosis not present

## 2023-03-12 DIAGNOSIS — N185 Chronic kidney disease, stage 5: Secondary | ICD-10-CM | POA: Diagnosis not present

## 2023-03-12 DIAGNOSIS — D631 Anemia in chronic kidney disease: Secondary | ICD-10-CM | POA: Diagnosis not present

## 2023-03-12 DIAGNOSIS — E872 Acidosis, unspecified: Secondary | ICD-10-CM | POA: Diagnosis not present

## 2023-04-15 DIAGNOSIS — N185 Chronic kidney disease, stage 5: Secondary | ICD-10-CM | POA: Diagnosis not present

## 2023-04-23 DIAGNOSIS — N185 Chronic kidney disease, stage 5: Secondary | ICD-10-CM | POA: Diagnosis not present

## 2023-04-23 DIAGNOSIS — D631 Anemia in chronic kidney disease: Secondary | ICD-10-CM | POA: Diagnosis not present

## 2023-04-23 DIAGNOSIS — I770 Arteriovenous fistula, acquired: Secondary | ICD-10-CM | POA: Diagnosis not present

## 2023-04-23 DIAGNOSIS — N2581 Secondary hyperparathyroidism of renal origin: Secondary | ICD-10-CM | POA: Diagnosis not present

## 2023-04-23 DIAGNOSIS — E872 Acidosis, unspecified: Secondary | ICD-10-CM | POA: Diagnosis not present

## 2023-04-23 DIAGNOSIS — I120 Hypertensive chronic kidney disease with stage 5 chronic kidney disease or end stage renal disease: Secondary | ICD-10-CM | POA: Diagnosis not present

## 2023-05-28 DIAGNOSIS — N185 Chronic kidney disease, stage 5: Secondary | ICD-10-CM | POA: Diagnosis not present

## 2023-06-03 DIAGNOSIS — E872 Acidosis, unspecified: Secondary | ICD-10-CM | POA: Diagnosis not present

## 2023-06-03 DIAGNOSIS — N2581 Secondary hyperparathyroidism of renal origin: Secondary | ICD-10-CM | POA: Diagnosis not present

## 2023-06-03 DIAGNOSIS — I770 Arteriovenous fistula, acquired: Secondary | ICD-10-CM | POA: Diagnosis not present

## 2023-06-03 DIAGNOSIS — D631 Anemia in chronic kidney disease: Secondary | ICD-10-CM | POA: Diagnosis not present

## 2023-06-03 DIAGNOSIS — N185 Chronic kidney disease, stage 5: Secondary | ICD-10-CM | POA: Diagnosis not present

## 2023-06-03 DIAGNOSIS — I120 Hypertensive chronic kidney disease with stage 5 chronic kidney disease or end stage renal disease: Secondary | ICD-10-CM | POA: Diagnosis not present

## 2023-07-09 DIAGNOSIS — N185 Chronic kidney disease, stage 5: Secondary | ICD-10-CM | POA: Diagnosis not present

## 2023-07-16 DIAGNOSIS — N185 Chronic kidney disease, stage 5: Secondary | ICD-10-CM | POA: Diagnosis not present

## 2023-07-16 DIAGNOSIS — E872 Acidosis, unspecified: Secondary | ICD-10-CM | POA: Diagnosis not present

## 2023-07-16 DIAGNOSIS — I120 Hypertensive chronic kidney disease with stage 5 chronic kidney disease or end stage renal disease: Secondary | ICD-10-CM | POA: Diagnosis not present

## 2023-07-16 DIAGNOSIS — I770 Arteriovenous fistula, acquired: Secondary | ICD-10-CM | POA: Diagnosis not present

## 2023-07-16 DIAGNOSIS — D631 Anemia in chronic kidney disease: Secondary | ICD-10-CM | POA: Diagnosis not present

## 2023-07-16 DIAGNOSIS — N2581 Secondary hyperparathyroidism of renal origin: Secondary | ICD-10-CM | POA: Diagnosis not present

## 2023-08-20 DIAGNOSIS — N185 Chronic kidney disease, stage 5: Secondary | ICD-10-CM | POA: Diagnosis not present

## 2023-08-28 DIAGNOSIS — N185 Chronic kidney disease, stage 5: Secondary | ICD-10-CM | POA: Diagnosis not present

## 2023-08-28 DIAGNOSIS — I770 Arteriovenous fistula, acquired: Secondary | ICD-10-CM | POA: Diagnosis not present

## 2023-08-28 DIAGNOSIS — I120 Hypertensive chronic kidney disease with stage 5 chronic kidney disease or end stage renal disease: Secondary | ICD-10-CM | POA: Diagnosis not present

## 2023-08-28 DIAGNOSIS — I10 Essential (primary) hypertension: Secondary | ICD-10-CM | POA: Diagnosis not present

## 2023-08-28 DIAGNOSIS — E875 Hyperkalemia: Secondary | ICD-10-CM | POA: Diagnosis not present

## 2023-08-28 DIAGNOSIS — N2581 Secondary hyperparathyroidism of renal origin: Secondary | ICD-10-CM | POA: Diagnosis not present

## 2023-08-28 DIAGNOSIS — N189 Chronic kidney disease, unspecified: Secondary | ICD-10-CM | POA: Diagnosis not present

## 2023-08-28 DIAGNOSIS — E872 Acidosis, unspecified: Secondary | ICD-10-CM | POA: Diagnosis not present

## 2023-08-28 DIAGNOSIS — D631 Anemia in chronic kidney disease: Secondary | ICD-10-CM | POA: Diagnosis not present

## 2023-09-02 LAB — COMPREHENSIVE METABOLIC PANEL
Albumin: 3.6 (ref 3.5–5.0)
Calcium: 9 (ref 8.7–10.7)
eGFR: 14

## 2023-09-02 LAB — BASIC METABOLIC PANEL
BUN: 19 (ref 4–21)
CO2: 19 (ref 13–22)
Chloride: 103 (ref 99–108)
Creatinine: 3.2 — AB (ref 0.5–1.1)
Glucose: 81
Potassium: 5.5 meq/L — AB (ref 3.5–5.1)
Sodium: 135 — AB (ref 137–147)

## 2023-09-09 DIAGNOSIS — N185 Chronic kidney disease, stage 5: Secondary | ICD-10-CM | POA: Diagnosis not present

## 2023-09-13 ENCOUNTER — Other Ambulatory Visit: Payer: Self-pay | Admitting: Internal Medicine

## 2023-09-13 DIAGNOSIS — E039 Hypothyroidism, unspecified: Secondary | ICD-10-CM

## 2023-09-17 ENCOUNTER — Other Ambulatory Visit: Payer: Self-pay | Admitting: Internal Medicine

## 2023-09-17 DIAGNOSIS — E039 Hypothyroidism, unspecified: Secondary | ICD-10-CM

## 2023-10-01 ENCOUNTER — Telehealth: Payer: Self-pay | Admitting: Internal Medicine

## 2023-10-01 NOTE — Telephone Encounter (Signed)
Patient's daughter-in-law Eunice Blase called and requested a refill for patient's levothyroxine (SYNTHROID) 75 MCG tablet. Patient has not been seen since 02/12/22, so they scheduled an appointment for Dr. Yetta Barre' first opening on 10/28/23. Patient needs a refill to last until that appointment and would like to know if they can get something to last until that appointment. Please advise. Best callback for Eunice Blase is [phone].

## 2023-10-03 NOTE — Telephone Encounter (Signed)
Number that's left to call back is no longer in service.

## 2023-10-06 NOTE — Telephone Encounter (Signed)
Will you send in a script just to get the patient to her appointment that she has scheduled ?

## 2023-10-08 DIAGNOSIS — N185 Chronic kidney disease, stage 5: Secondary | ICD-10-CM | POA: Diagnosis not present

## 2023-10-13 DIAGNOSIS — I770 Arteriovenous fistula, acquired: Secondary | ICD-10-CM | POA: Diagnosis not present

## 2023-10-13 DIAGNOSIS — N189 Chronic kidney disease, unspecified: Secondary | ICD-10-CM | POA: Diagnosis not present

## 2023-10-13 DIAGNOSIS — N2581 Secondary hyperparathyroidism of renal origin: Secondary | ICD-10-CM | POA: Diagnosis not present

## 2023-10-13 DIAGNOSIS — I120 Hypertensive chronic kidney disease with stage 5 chronic kidney disease or end stage renal disease: Secondary | ICD-10-CM | POA: Diagnosis not present

## 2023-10-13 DIAGNOSIS — I10 Essential (primary) hypertension: Secondary | ICD-10-CM | POA: Diagnosis not present

## 2023-10-13 DIAGNOSIS — N185 Chronic kidney disease, stage 5: Secondary | ICD-10-CM | POA: Diagnosis not present

## 2023-10-13 DIAGNOSIS — E872 Acidosis, unspecified: Secondary | ICD-10-CM | POA: Diagnosis not present

## 2023-10-13 DIAGNOSIS — D631 Anemia in chronic kidney disease: Secondary | ICD-10-CM | POA: Diagnosis not present

## 2023-10-25 ENCOUNTER — Other Ambulatory Visit: Payer: Self-pay | Admitting: Internal Medicine

## 2023-10-25 DIAGNOSIS — E039 Hypothyroidism, unspecified: Secondary | ICD-10-CM

## 2023-10-28 ENCOUNTER — Ambulatory Visit: Payer: Medicare Other | Admitting: Internal Medicine

## 2023-10-28 ENCOUNTER — Encounter: Payer: Self-pay | Admitting: Internal Medicine

## 2023-10-28 VITALS — BP 146/60 | HR 84 | Temp 97.8°F | Resp 16 | Wt 114.2 lb

## 2023-10-28 DIAGNOSIS — Z0001 Encounter for general adult medical examination with abnormal findings: Secondary | ICD-10-CM | POA: Diagnosis not present

## 2023-10-28 DIAGNOSIS — K745 Biliary cirrhosis, unspecified: Secondary | ICD-10-CM

## 2023-10-28 DIAGNOSIS — E039 Hypothyroidism, unspecified: Secondary | ICD-10-CM

## 2023-10-28 DIAGNOSIS — K219 Gastro-esophageal reflux disease without esophagitis: Secondary | ICD-10-CM | POA: Diagnosis not present

## 2023-10-28 LAB — HEPATIC FUNCTION PANEL
ALT: 14 U/L (ref 0–35)
AST: 27 U/L (ref 0–37)
Albumin: 3.5 g/dL (ref 3.5–5.2)
Alkaline Phosphatase: 150 U/L — ABNORMAL HIGH (ref 39–117)
Bilirubin, Direct: 0.1 mg/dL (ref 0.0–0.3)
Total Bilirubin: 0.4 mg/dL (ref 0.2–1.2)
Total Protein: 7.6 g/dL (ref 6.0–8.3)

## 2023-10-28 LAB — CBC WITH DIFFERENTIAL/PLATELET
Basophils Absolute: 0.1 10*3/uL (ref 0.0–0.1)
Basophils Relative: 1.3 % (ref 0.0–3.0)
Eosinophils Absolute: 0.2 10*3/uL (ref 0.0–0.7)
Eosinophils Relative: 3.4 % (ref 0.0–5.0)
HCT: 35.5 % — ABNORMAL LOW (ref 36.0–46.0)
Hemoglobin: 11.6 g/dL — ABNORMAL LOW (ref 12.0–15.0)
Lymphocytes Relative: 27.2 % (ref 12.0–46.0)
Lymphs Abs: 1.6 10*3/uL (ref 0.7–4.0)
MCHC: 32.8 g/dL (ref 30.0–36.0)
MCV: 94.1 fL (ref 78.0–100.0)
Monocytes Absolute: 0.5 10*3/uL (ref 0.1–1.0)
Monocytes Relative: 7.9 % (ref 3.0–12.0)
Neutro Abs: 3.5 10*3/uL (ref 1.4–7.7)
Neutrophils Relative %: 60.2 % (ref 43.0–77.0)
Platelets: 219 10*3/uL (ref 150.0–400.0)
RBC: 3.77 Mil/uL — ABNORMAL LOW (ref 3.87–5.11)
RDW: 13.9 % (ref 11.5–15.5)
WBC: 5.8 10*3/uL (ref 4.0–10.5)

## 2023-10-28 LAB — TSH: TSH: 5.94 u[IU]/mL — ABNORMAL HIGH (ref 0.35–5.50)

## 2023-10-28 LAB — PROTIME-INR
INR: 1 {ratio} (ref 0.8–1.0)
Prothrombin Time: 10.9 s (ref 9.6–13.1)

## 2023-10-28 NOTE — Patient Instructions (Signed)

## 2023-10-28 NOTE — Progress Notes (Signed)
Subjective:  Patient ID: Shari Schmitt, female    DOB: 1941-05-12  Age: 82 y.o. MRN: [id number]  CC: Hypertension, Hypothyroidism, and Annual Exam   HPI VILETTA SEDA presents for a CPX and f/up ----  Discussed the use of AI scribe software for clinical note transcription with the patient, who gave verbal consent to proceed.  History of Present Illness   The patient, with a history of kidney disease, reports feeling well with no complaints of chest pain, shortness of breath, dizziness, lightheadedness, constipation, or diarrhea. The patient's kidney function has been stable, with no current need for dialysis despite previous consideration and port placement. The patient's diet is monitored for potassium levels due to kidney disease.  The patient has ceased driving but maintains independence with assistance for transportation as needed. The patient's thyroid levels were a concern, prompting this visit. There was a single episode of ankle swelling which resolved with Lasix. The patient denies any current swelling or pain in the ankles.  The patient has a known heart murmur, which has been stable and asymptomatic for many years. The patient denies any need for cardiology follow-up. The patient has received a flu shot, pneumonia shot, and shingles vaccine within the past year.  The patient's blood pressure was slightly elevated during the visit, but the patient attributes this to anxiety and does not believe medication is necessary. The patient denies any recent nausea or vomiting.       Outpatient Medications Prior to Visit  Medication Sig Dispense Refill   albuterol (VENTOLIN HFA) 108 (90 Base) MCG/ACT inhaler TAKE 2 PUFFS BY MOUTH EVERY 6 HOURS AS NEEDED FOR WHEEZE OR SHORTNESS OF BREATH 20.1 each 2   Cholecalciferol (VITAMIN D3) 50 MCG (2000 UT) capsule TAKE 1 CAPSULE BY MOUTH EVERY DAY 60 capsule 1   esomeprazole (NEXIUM) 40 MG capsule TAKE 1 CAPSULE BY MOUTH EVERY DAY BEFORE  BREAKFAST 90 capsule 1   fluocinonide gel (LIDEX) 0.05 % APPLY TO AFFECTED AREA(S)  TOPICALLY TWO TIMES DAILY  AS NEEDED (Patient taking differently: Apply 1 application  topically 2 (two) times daily as needed (rash).) 60 g 3   sodium bicarbonate 650 MG tablet Take 650 mg by mouth daily.     ursodiol (ACTIGALL) 300 MG capsule TAKE 1 CAPSULE BY MOUTH THREE TIMES A DAY 270 capsule 1   levothyroxine (SYNTHROID) 75 MCG tablet TAKE 1 TABLET BY MOUTH EVERY DAY 30 tablet 0   No facility-administered medications prior to visit.    ROS Review of Systems  Constitutional:  Positive for fatigue. Negative for appetite change, diaphoresis and unexpected weight change.  HENT: Negative.    Eyes: Negative.   Respiratory:  Negative for cough, shortness of breath and wheezing.   Cardiovascular:  Negative for chest pain, palpitations and leg swelling.  Gastrointestinal:  Negative for abdominal pain, constipation, diarrhea, nausea and vomiting.  Endocrine: Negative.   Genitourinary: Negative.  Negative for difficulty urinating and dysuria.  Musculoskeletal: Negative.  Negative for arthralgias and myalgias.  Skin: Negative.   Neurological: Negative.  Negative for dizziness and weakness.  Hematological:  Negative for adenopathy. Does not bruise/bleed easily.  Psychiatric/Behavioral:  Positive for confusion and decreased concentration. Negative for self-injury and sleep disturbance. The patient is nervous/anxious.     Objective:  BP (!) 146/60 (BP Location: Right Arm, Patient Position: Sitting, Cuff Size: Normal)   Pulse 84   Temp 97.8 F (36.6 C) (Oral)   Resp 16   Wt 114 lb 3.2 oz (  51.8 kg)   LMP  (LMP Unknown)   SpO2 98%   BMI 21.58 kg/m   BP Readings from Last 3 Encounters:  10/28/23 (!) 146/60  02/12/22 126/60  07/03/21 110/60    Wt Readings from Last 3 Encounters:  10/28/23 114 lb 3.2 oz (51.8 kg)  02/12/22 100 lb (45.4 kg)  07/03/21 97 lb (44 kg)    Physical Exam Vitals reviewed.   Constitutional:      Appearance: Normal appearance.  HENT:     Mouth/Throat:     Mouth: Mucous membranes are moist.  Eyes:     General: No scleral icterus.    Conjunctiva/sclera: Conjunctivae normal.  Cardiovascular:     Rate and Rhythm: Normal rate and regular rhythm.     Heart sounds: Murmur heard.     Systolic murmur is present with a grade of 2/6.     No friction rub. No gallop.     Comments: 2/6 holosystolic murmur Pulmonary:     Effort: Pulmonary effort is normal.     Breath sounds: No stridor. No wheezing, rhonchi or rales.  Abdominal:     General: Abdomen is flat.     Palpations: There is no mass.     Tenderness: There is no abdominal tenderness. There is no guarding.     Hernia: No hernia is present.  Musculoskeletal:        General: Normal range of motion.     Cervical back: Neck supple.     Right lower leg: No edema.     Left lower leg: No edema.  Lymphadenopathy:     Cervical: No cervical adenopathy.  Skin:    General: Skin is warm and dry.     Coloration: Skin is pale.  Neurological:     General: No focal deficit present.     Mental Status: She is alert. Mental status is at baseline.  Psychiatric:        Mood and Affect: Mood normal.        Behavior: Behavior normal.     Lab Results  Component Value Date   WBC 5.8 10/28/2023   HGB 11.6 (L) 10/28/2023   HCT 35.5 (L) 10/28/2023   PLT 219.0 10/28/2023   GLUCOSE 76 01/15/2021   CHOL 204 (H) 11/04/2019   TRIG 87.0 11/04/2019   HDL 82.20 11/04/2019   LDLCALC 104 (H) 11/04/2019   ALT 14 10/28/2023   AST 27 10/28/2023   NA 135 (A) 09/02/2023   K 5.5 (A) 09/02/2023   CL 103 09/02/2023   CREATININE 3.2 (A) 09/02/2023   BUN 19 09/02/2023   CO2 19 09/02/2023   TSH 5.94 (H) 10/28/2023   INR 1.0 10/28/2023    VAS US DUPLEX DIALYSIS ACCESS (AVF,AVG)  Result Date: 03/15/2021 DIALYSIS ACCESS Reason for Exam: Routine follow up. Access Site: Left Upper Extremity. Access Type: Brachial-cephalic AVF.  History: Left BCAVF created on 01/15/21. Performing Technologist: Thereasa Parkin RVT  Examination Guidelines: A complete evaluation includes B-mode imaging, spectral Doppler, color Doppler, and power Doppler as needed of all accessible portions of each vessel. Unilateral testing is considered an integral part of a complete examination. Limited examinations for reoccurring indications may be performed as noted.  Findings: +--------------------+----------+-----------------+--------+ AVF                 PSV (cm/s)Flow Vol (mL/min)Comments +--------------------+----------+-----------------+--------+ Native artery inflow   261           833                +--------------------+----------+-----------------+--------+  AVF Anastomosis        736                              +--------------------+----------+-----------------+--------+  +------------+----------+-------------+----------+--------+ OUTFLOW VEINPSV (cm/s)Diameter (cm)Depth (cm)Describe +------------+----------+-------------+----------+--------+ Prox UA        109        0.64        0.54            +------------+----------+-------------+----------+--------+ Mid UA          85        0.60        0.33            +------------+----------+-------------+----------+--------+ Dist UA         68        0.71        0.24            +------------+----------+-------------+----------+--------+ AC Fossa       181        0.84        0.22            +------------+----------+-------------+----------+--------+   Summary: Patent left brachiocephalic AVF. No evidence of stenosis or thrombus. No significant branches noted.  *See table(s) above for measurements and observations.  Diagnosing physician: Fabienne Bruns MD Electronically signed by Fabienne Bruns MD on 03/15/2021 at 4:15:25 PM.    --------------------------------------------------------------------------------   Final     Assessment & Plan:   Acquired hypothyroidism- She is  euthyroid. -     CBC with Differential/Platelet; Future -     TSH; Future -     Levothyroxine Sodium; Take 1 tablet (75 mcg total) by mouth daily.  Dispense: 90 tablet; Refill: 1  Gastroesophageal reflux disease without esophagitis -     CBC with Differential/Platelet; Future  CIRRHOSIS, BILIARY- LFTs and INR are normal. -     CBC with Differential/Platelet; Future -     Hepatic function panel; Future -     Protime-INR; Future  Encounter for general adult medical examination with abnormal findings - Exam completed, labs reviewed, vaccines reviewed, no cancer screenings indicated, pt ed material was given.      Follow-up: Return in about 6 months (around 04/27/2024).  Sanda Linger, MD

## 2023-10-29 MED ORDER — LEVOTHYROXINE SODIUM 75 MCG PO TABS: 75.0000 ug | ORAL_TABLET | Freq: Every day | ORAL | 1 refills | Status: DC

## 2023-11-17 ENCOUNTER — Other Ambulatory Visit: Payer: Self-pay | Admitting: Internal Medicine

## 2023-12-03 DIAGNOSIS — E872 Acidosis, unspecified: Secondary | ICD-10-CM | POA: Diagnosis not present

## 2023-12-03 DIAGNOSIS — K743 Primary biliary cirrhosis: Secondary | ICD-10-CM | POA: Diagnosis not present

## 2023-12-03 DIAGNOSIS — N185 Chronic kidney disease, stage 5: Secondary | ICD-10-CM | POA: Diagnosis not present

## 2023-12-03 DIAGNOSIS — I770 Arteriovenous fistula, acquired: Secondary | ICD-10-CM | POA: Diagnosis not present

## 2023-12-03 DIAGNOSIS — I10 Essential (primary) hypertension: Secondary | ICD-10-CM | POA: Diagnosis not present

## 2023-12-03 DIAGNOSIS — D631 Anemia in chronic kidney disease: Secondary | ICD-10-CM | POA: Diagnosis not present

## 2023-12-03 DIAGNOSIS — I120 Hypertensive chronic kidney disease with stage 5 chronic kidney disease or end stage renal disease: Secondary | ICD-10-CM | POA: Diagnosis not present

## 2023-12-03 DIAGNOSIS — N2581 Secondary hyperparathyroidism of renal origin: Secondary | ICD-10-CM | POA: Diagnosis not present

## 2023-12-03 DIAGNOSIS — N189 Chronic kidney disease, unspecified: Secondary | ICD-10-CM | POA: Diagnosis not present

## 2023-12-03 LAB — LAB REPORT - SCANNED: EGFR: 12

## 2023-12-31 ENCOUNTER — Ambulatory Visit (INDEPENDENT_AMBULATORY_CARE_PROVIDER_SITE_OTHER): Payer: Medicare Other

## 2023-12-31 ENCOUNTER — Ambulatory Visit
Admission: EM | Admit: 2023-12-31 | Discharge: 2023-12-31 | Disposition: A | Discharge status: Home / Self Care | Payer: Medicare Other | Attending: Family Medicine | Admitting: Family Medicine

## 2023-12-31 DIAGNOSIS — M47816 Spondylosis without myelopathy or radiculopathy, lumbar region: Secondary | ICD-10-CM | POA: Diagnosis not present

## 2023-12-31 DIAGNOSIS — R3129 Other microscopic hematuria: Secondary | ICD-10-CM

## 2023-12-31 DIAGNOSIS — M533 Sacrococcygeal disorders, not elsewhere classified: Secondary | ICD-10-CM

## 2023-12-31 DIAGNOSIS — M8580 Other specified disorders of bone density and structure, unspecified site: Secondary | ICD-10-CM | POA: Diagnosis not present

## 2023-12-31 LAB — POCT URINALYSIS DIP (MANUAL ENTRY)
Bilirubin, UA: NEGATIVE
Glucose, UA: 500 mg/dL — AB
Ketones, POC UA: NEGATIVE mg/dL
Nitrite, UA: NEGATIVE
Protein Ur, POC: 100 mg/dL — AB
Spec Grav, UA: 1.025 (ref 1.010–1.025)
Urobilinogen, UA: 0.2 U/dL
pH, UA: 7 (ref 5.0–8.0)

## 2023-12-31 NOTE — ED Provider Notes (Signed)
 [facility]    CSN: [id number] Arrival date & time: 12/31/23  1126      History   Chief Complaint Chief Complaint  Patient presents with   Back Pain    HPI Shari Schmitt is a 83 y.o. female.   Lovely 83 year old woman brought in by her son and daughter-in-law for evaluation of back pain.  She has had back pain for about a week.  She states that she has not had any falling, accident, or injury.  She did not do any heavy lifting.  Of note she does have some minor impairment of memory.  Family does not note any incident that might of hurt her back.  She does not usually have back pain.  She does have known osteoporosis.  She has no urinary symptoms.  No history of recurrent urinary infections.  The pain is in the very low back and she points to her sacrum.  It is more on the left than the right side of her sacrum. At her request, family has been giving her Doans pills.  This is magnesium  salicylate.  I looked at her most recent lab work and her GFR is 12.  I do not recommend that an elderly woman with kidney failure take magnesium  salicylate and have asked them to please give her Tylenol     Past Medical History:  Diagnosis Date   Anxiety    Asthma    Biliary cirrhosis (HCC)    Celiac disease    COPD (chronic obstructive pulmonary disease) (HCC)    Dermatitis herpetiformis    Diverticulitis    GERD (gastroesophageal reflux disease)    HLD (hyperlipidemia)    diet controlled   Hyperthyroidism    Hypotension    Kidney disease    Pancreas disorder    Renal insufficiency     Patient Active Problem List   Diagnosis Date Noted   Grade 3 out of 6 intensity murmur 02/12/2022   Encounter for general adult medical examination with abnormal findings 07/03/2021   Psychophysiological insomnia 11/04/2019   Allergic rhinitis due to pollen 09/11/2015   Vitamin D  deficiency 07/13/2015   Visit for screening mammogram 07/12/2015   Asthma, chronic 05/16/2015   GAD  (generalized anxiety disorder) 06/14/2013   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   Celiac disease 03/06/2011   Chronic renal disease 03/06/2011   Hypothyroidism 08/18/2008   GERD 08/18/2008   CIRRHOSIS, BILIARY 08/18/2008   DEGENERATIVE JOINT DISEASE 08/18/2008   Osteoporosis 08/18/2008    Past Surgical History:  Procedure Laterality Date   AV FISTULA PLACEMENT Left 01/15/2021   Procedure: LEFT Brachial - cephalic  ARTERIOVENOUS (AV) FISTULA CREATION.;  Surgeon: Harvey Carlin BRAVO, MD;  Location: [facility] OR;  Service: Vascular;  Laterality: Left;  PERIPHERAL NERVE BLOCK   COLONOSCOPY     ELBOW SURGERY Bilateral    tendon repair per patient   EYE SURGERY Bilateral    cataracts   MULTIPLE TOOTH EXTRACTIONS     PANCREAS SURGERY  1962   80% removed   THYROIDECTOMY  1965   UPPER GI ENDOSCOPY      OB History   No obstetric history on file.      Home Medications    Prior to Admission medications   Medication Sig Start Date End Date Taking? Authorizing Provider  albuterol  (VENTOLIN  HFA) 108 (90 Base) MCG/ACT inhaler TAKE 2 PUFFS BY MOUTH EVERY 6 HOURS AS NEEDED FOR WHEEZE OR SHORTNESS OF BREATH 11/18/23   Joshua Debby [facility], MD  Cholecalciferol  (  VITAMIN D3) 50 MCG (2000 UT) capsule TAKE 1 CAPSULE BY MOUTH EVERY DAY 11/26/21   Joshua Debby CROME, MD  esomeprazole  (NEXIUM ) 40 MG capsule TAKE 1 CAPSULE BY MOUTH EVERY DAY BEFORE BREAKFAST 05/14/22   Joshua Debby CROME, MD  fluocinonide  gel (LIDEX ) 0.05 % APPLY TO AFFECTED AREA(S)  TOPICALLY TWO TIMES DAILY  AS NEEDED Patient taking differently: Apply 1 application  topically 2 (two) times daily as needed (rash). 11/04/19   Joshua Debby CROME, MD  levothyroxine  (SYNTHROID ) 75 MCG tablet Take 1 tablet (75 mcg total) by mouth daily. 10/29/23   Joshua Debby CROME, MD  sodium bicarbonate 650 MG tablet Take 650 mg by mouth daily. 12/02/20   [provider]  ursodiol  (ACTIGALL ) 300 MG capsule TAKE 1 CAPSULE BY MOUTH THREE TIMES A DAY 01/31/23   Joshua Debby CROME,  MD    Family History Family History  Problem Relation Age of Onset   Emphysema Father        deceased   Heart disease Mother        deceased   Kidney disease Brother    Colon cancer Neg Hx     Social History Social History   Tobacco Use   Smoking status: Former    Current packs/day: 0.00    Types: Cigarettes    Quit date: 11/25/1981    Years since quitting: 42.1   Smokeless tobacco: Never  Vaping Use   Vaping status: Never Used  Substance Use Topics   Alcohol use: No   Drug use: No     Allergies   Codeine and Gluten meal   Review of Systems Review of Systems See HPI  Physical Exam Triage Vital Signs ED Triage Vitals [12/31/23 1157]  Encounter Vitals Group     BP 112/60     Systolic BP Percentile      Diastolic BP Percentile      Pulse Rate 81     Resp 17     Temp (!) 97.5 F (36.4 C)     Temp Source Oral     SpO2 100 %     Weight      Height      Head Circumference      Peak Flow      Pain Score 7     Pain Loc      Pain Education      Exclude from Growth Chart    No data found.  Updated Vital Signs BP 112/60 (BP Location: Right Arm)   Pulse 81   Temp (!) 97.5 F (36.4 C) (Oral)   Resp 17   LMP  (LMP Unknown)   SpO2 100%      Physical Exam Constitutional:      General: She is in acute distress.     Appearance: She is well-developed and normal weight.     Comments: Guarded movements  HENT:     Head: Normocephalic and atraumatic.  Musculoskeletal:        General: Tenderness present. Normal range of motion.     Comments: Tenderness to palpation in the central sacrum left sacrum no palpable deformity.  Tenderness is mild to moderate.  Mobility is poor.  Requires assistance for ambulation  Skin:    General: Skin is warm and dry.     Findings: No bruising.  Neurological:     Mental Status: She is alert.      UC Treatments / Results  Labs (all labs ordered are listed, but only  abnormal results are displayed) Labs Reviewed  POCT  URINALYSIS DIP (MANUAL ENTRY) - Abnormal; Notable for the following components:      Result Value   Clarity, UA cloudy (*)    Glucose, UA =500 (*)    Blood, UA trace-intact (*)    Protein Ur, POC =100 (*)    Leukocytes, UA Trace (*)    All other components within normal limits  URINE CULTURE    EKG   Radiology DG Sacrum/Coccyx Result Date: 12/31/2023 CLINICAL DATA:  Left sacral pain for the past 6 days. No known injury. Known osteopenia. EXAM: SACRUM AND COCCYX - 2+ VIEW COMPARISON:  None Available. FINDINGS: Diffuse osteopenia. No visible fracture or subluxation. Mild lower lumbar spine degenerative changes. IMPRESSION: 1. No visible fracture. If further evaluation is clinically indicated, sacrococcygeal MRI or CT without contrast would be more sensitive for detecting sacral insufficiency fractures. 2. Diffuse osteopenia. Electronically Signed   By: Elspeth Bathe M.D.   On: 12/31/2023 14:38    Procedures Procedures (including critical care time)  Medications Ordered in UC Medications - No data to display  Initial Impression / Assessment and Plan / UC Course  I have reviewed the triage vital signs and the nursing notes.  Pertinent labs & imaging results that were available during my care of the patient were reviewed by me and considered in my medical decision making (see chart for details).     X-rays are delayed today.  Patient was discharged pending x-ray results.  I did not see any evidence of a sacral insufficiency fracture but warned the family that I would call them if any abnormality is identified  Addendum, x-ray removed.  Family notified by telephone. Final Clinical Impressions(s) / UC Diagnoses   Final diagnoses:  Pain in sacrum  Microscopic hematuria     Discharge Instructions      Limit activity while back is painful May take Tylenol  for pain (avoid anti-inflammatory medication like Doans pills or Aleve or Advil) May use ice or heat to area See your doctor  if not improving by next week  You will be called if the urine culture shows infection You will be called if the back x-ray identifies a problem    ED Prescriptions   None    PDMP not reviewed this encounter.   Maranda Jamee Jacob, MD 12/31/23 [phone]

## 2023-12-31 NOTE — ED Triage Notes (Signed)
Pt here today with daughter in law c/o lower back pain since Thurs. Says she strained her back. OTC back ache medicine as well as advil.

## 2023-12-31 NOTE — Discharge Instructions (Addendum)
 Limit activity while back is painful May take Tylenol  for pain (avoid anti-inflammatory medication like Doans pills or Aleve or Advil) May use ice or heat to area See your doctor if not improving by next week  You will be called if the urine culture shows infection You will be called if the back x-ray identifies a problem

## 2024-01-01 LAB — URINE CULTURE
Culture: NO GROWTH
Special Requests: NORMAL

## 2024-01-27 DIAGNOSIS — N185 Chronic kidney disease, stage 5: Secondary | ICD-10-CM | POA: Diagnosis not present

## 2024-02-03 DIAGNOSIS — I10 Essential (primary) hypertension: Secondary | ICD-10-CM | POA: Diagnosis not present

## 2024-02-03 DIAGNOSIS — N189 Chronic kidney disease, unspecified: Secondary | ICD-10-CM | POA: Diagnosis not present

## 2024-02-03 DIAGNOSIS — E872 Acidosis, unspecified: Secondary | ICD-10-CM | POA: Diagnosis not present

## 2024-02-03 DIAGNOSIS — N2581 Secondary hyperparathyroidism of renal origin: Secondary | ICD-10-CM | POA: Diagnosis not present

## 2024-02-03 DIAGNOSIS — N250 Renal osteodystrophy: Secondary | ICD-10-CM | POA: Diagnosis not present

## 2024-02-03 DIAGNOSIS — K743 Primary biliary cirrhosis: Secondary | ICD-10-CM | POA: Diagnosis not present

## 2024-02-03 DIAGNOSIS — D631 Anemia in chronic kidney disease: Secondary | ICD-10-CM | POA: Diagnosis not present

## 2024-02-03 DIAGNOSIS — I120 Hypertensive chronic kidney disease with stage 5 chronic kidney disease or end stage renal disease: Secondary | ICD-10-CM | POA: Diagnosis not present

## 2024-02-03 DIAGNOSIS — N185 Chronic kidney disease, stage 5: Secondary | ICD-10-CM | POA: Diagnosis not present

## 2024-02-03 DIAGNOSIS — R748 Abnormal levels of other serum enzymes: Secondary | ICD-10-CM | POA: Diagnosis not present

## 2024-02-03 DIAGNOSIS — I770 Arteriovenous fistula, acquired: Secondary | ICD-10-CM | POA: Diagnosis not present

## 2024-03-24 DIAGNOSIS — N185 Chronic kidney disease, stage 5: Secondary | ICD-10-CM | POA: Diagnosis not present

## 2024-03-31 DIAGNOSIS — N189 Chronic kidney disease, unspecified: Secondary | ICD-10-CM | POA: Diagnosis not present

## 2024-03-31 DIAGNOSIS — I770 Arteriovenous fistula, acquired: Secondary | ICD-10-CM | POA: Diagnosis not present

## 2024-03-31 DIAGNOSIS — I10 Essential (primary) hypertension: Secondary | ICD-10-CM | POA: Diagnosis not present

## 2024-03-31 DIAGNOSIS — E872 Acidosis, unspecified: Secondary | ICD-10-CM | POA: Diagnosis not present

## 2024-03-31 DIAGNOSIS — I120 Hypertensive chronic kidney disease with stage 5 chronic kidney disease or end stage renal disease: Secondary | ICD-10-CM | POA: Diagnosis not present

## 2024-03-31 DIAGNOSIS — N2581 Secondary hyperparathyroidism of renal origin: Secondary | ICD-10-CM | POA: Diagnosis not present

## 2024-03-31 DIAGNOSIS — K743 Primary biliary cirrhosis: Secondary | ICD-10-CM | POA: Diagnosis not present

## 2024-03-31 DIAGNOSIS — N185 Chronic kidney disease, stage 5: Secondary | ICD-10-CM | POA: Diagnosis not present

## 2024-03-31 DIAGNOSIS — D631 Anemia in chronic kidney disease: Secondary | ICD-10-CM | POA: Diagnosis not present

## 2024-05-12 DIAGNOSIS — N185 Chronic kidney disease, stage 5: Secondary | ICD-10-CM | POA: Diagnosis not present

## 2024-05-21 ENCOUNTER — Other Ambulatory Visit: Payer: Self-pay | Admitting: Internal Medicine

## 2024-05-21 DIAGNOSIS — I770 Arteriovenous fistula, acquired: Secondary | ICD-10-CM | POA: Diagnosis not present

## 2024-05-21 DIAGNOSIS — N185 Chronic kidney disease, stage 5: Secondary | ICD-10-CM | POA: Diagnosis not present

## 2024-05-21 DIAGNOSIS — E872 Acidosis, unspecified: Secondary | ICD-10-CM | POA: Diagnosis not present

## 2024-05-21 DIAGNOSIS — D631 Anemia in chronic kidney disease: Secondary | ICD-10-CM | POA: Diagnosis not present

## 2024-05-21 DIAGNOSIS — E039 Hypothyroidism, unspecified: Secondary | ICD-10-CM

## 2024-05-21 DIAGNOSIS — I120 Hypertensive chronic kidney disease with stage 5 chronic kidney disease or end stage renal disease: Secondary | ICD-10-CM | POA: Diagnosis not present

## 2024-05-21 DIAGNOSIS — N2581 Secondary hyperparathyroidism of renal origin: Secondary | ICD-10-CM | POA: Diagnosis not present

## 2024-05-21 DIAGNOSIS — K743 Primary biliary cirrhosis: Secondary | ICD-10-CM | POA: Diagnosis not present

## 2024-07-09 DIAGNOSIS — N185 Chronic kidney disease, stage 5: Secondary | ICD-10-CM | POA: Diagnosis not present

## 2024-07-16 DIAGNOSIS — N2581 Secondary hyperparathyroidism of renal origin: Secondary | ICD-10-CM | POA: Diagnosis not present

## 2024-07-16 DIAGNOSIS — N185 Chronic kidney disease, stage 5: Secondary | ICD-10-CM | POA: Diagnosis not present

## 2024-07-16 DIAGNOSIS — I770 Arteriovenous fistula, acquired: Secondary | ICD-10-CM | POA: Diagnosis not present

## 2024-07-16 DIAGNOSIS — E872 Acidosis, unspecified: Secondary | ICD-10-CM | POA: Diagnosis not present

## 2024-07-16 DIAGNOSIS — D631 Anemia in chronic kidney disease: Secondary | ICD-10-CM | POA: Diagnosis not present

## 2024-07-16 DIAGNOSIS — I120 Hypertensive chronic kidney disease with stage 5 chronic kidney disease or end stage renal disease: Secondary | ICD-10-CM | POA: Diagnosis not present

## 2024-07-22 ENCOUNTER — Other Ambulatory Visit: Payer: Self-pay | Admitting: Internal Medicine

## 2024-07-22 DIAGNOSIS — E039 Hypothyroidism, unspecified: Secondary | ICD-10-CM

## 2024-08-22 ENCOUNTER — Other Ambulatory Visit: Payer: Self-pay | Admitting: Internal Medicine

## 2024-09-01 DIAGNOSIS — N185 Chronic kidney disease, stage 5: Secondary | ICD-10-CM | POA: Diagnosis not present

## 2024-09-08 DIAGNOSIS — I770 Arteriovenous fistula, acquired: Secondary | ICD-10-CM | POA: Diagnosis not present

## 2024-09-08 DIAGNOSIS — I120 Hypertensive chronic kidney disease with stage 5 chronic kidney disease or end stage renal disease: Secondary | ICD-10-CM | POA: Diagnosis not present

## 2024-09-08 DIAGNOSIS — D631 Anemia in chronic kidney disease: Secondary | ICD-10-CM | POA: Diagnosis not present

## 2024-09-08 DIAGNOSIS — N2581 Secondary hyperparathyroidism of renal origin: Secondary | ICD-10-CM | POA: Diagnosis not present

## 2024-09-08 DIAGNOSIS — N185 Chronic kidney disease, stage 5: Secondary | ICD-10-CM | POA: Diagnosis not present

## 2024-09-08 DIAGNOSIS — E872 Acidosis, unspecified: Secondary | ICD-10-CM | POA: Diagnosis not present
